# Patient Record
Sex: Female | Born: 1955
Health system: Southern US, Community
[De-identification: ages and names within clinical notes are randomized; demographics above are authoritative.]

## PROBLEM LIST (undated history)

## (undated) DIAGNOSIS — K219 Gastro-esophageal reflux disease without esophagitis: Secondary | ICD-10-CM

## (undated) DIAGNOSIS — Z973 Presence of spectacles and contact lenses: Secondary | ICD-10-CM

## (undated) DIAGNOSIS — F419 Anxiety disorder, unspecified: Secondary | ICD-10-CM

## (undated) DIAGNOSIS — Z9889 Other specified postprocedural states: Secondary | ICD-10-CM

## (undated) DIAGNOSIS — R51 Headache: Secondary | ICD-10-CM

## (undated) DIAGNOSIS — I10 Essential (primary) hypertension: Secondary | ICD-10-CM

## (undated) DIAGNOSIS — K5909 Other constipation: Secondary | ICD-10-CM

## (undated) DIAGNOSIS — R112 Nausea with vomiting, unspecified: Secondary | ICD-10-CM

## (undated) DIAGNOSIS — Z1371 Encounter for nonprocreative screening for genetic disease carrier status: Secondary | ICD-10-CM

## (undated) HISTORY — PX: WISDOM TOOTH EXTRACTION: SHX21

## (undated) HISTORY — PX: OTHER SURGICAL HISTORY: SHX169

## (undated) HISTORY — DX: Essential (primary) hypertension: I10

## (undated) HISTORY — DX: Headache: R51

## (undated) HISTORY — DX: Nausea with vomiting, unspecified: R11.2

## (undated) HISTORY — PX: BREAST EXCISIONAL BIOPSY: SUR124

## (undated) HISTORY — DX: Other specified postprocedural states: Z98.890

## (undated) HISTORY — DX: Encounter for nonprocreative screening for genetic disease carrier status: Z13.71

## (undated) HISTORY — DX: Gastro-esophageal reflux disease without esophagitis: K21.9

---

## 1998-11-04 ENCOUNTER — Emergency Department (HOSPITAL_COMMUNITY): Admission: EM | Admit: 1998-11-04 | Discharge: 1998-11-05 | Payer: Self-pay | Admitting: Emergency Medicine

## 1999-06-02 ENCOUNTER — Other Ambulatory Visit: Admission: RE | Admit: 1999-06-02 | Discharge: 1999-06-02 | Payer: Self-pay | Admitting: Obstetrics and Gynecology

## 1999-06-26 ENCOUNTER — Ambulatory Visit (HOSPITAL_BASED_OUTPATIENT_CLINIC_OR_DEPARTMENT_OTHER): Admission: RE | Admit: 1999-06-26 | Discharge: 1999-06-26 | Payer: Self-pay | Admitting: *Deleted

## 1999-06-26 ENCOUNTER — Encounter (INDEPENDENT_AMBULATORY_CARE_PROVIDER_SITE_OTHER): Payer: Self-pay | Admitting: *Deleted

## 1999-10-12 ENCOUNTER — Emergency Department (HOSPITAL_COMMUNITY): Admission: EM | Admit: 1999-10-12 | Discharge: 1999-10-12 | Payer: Self-pay | Admitting: Internal Medicine

## 2000-06-10 ENCOUNTER — Other Ambulatory Visit: Admission: RE | Admit: 2000-06-10 | Discharge: 2000-06-10 | Payer: Self-pay | Admitting: Obstetrics and Gynecology

## 2000-11-23 ENCOUNTER — Emergency Department (HOSPITAL_COMMUNITY): Admission: EM | Admit: 2000-11-23 | Discharge: 2000-11-23 | Payer: Self-pay | Admitting: Emergency Medicine

## 2001-07-04 ENCOUNTER — Other Ambulatory Visit: Admission: RE | Admit: 2001-07-04 | Discharge: 2001-07-04 | Payer: Self-pay | Admitting: Obstetrics and Gynecology

## 2002-10-12 ENCOUNTER — Other Ambulatory Visit: Admission: RE | Admit: 2002-10-12 | Discharge: 2002-10-12 | Payer: Self-pay | Admitting: Obstetrics and Gynecology

## 2003-12-28 ENCOUNTER — Encounter: Admission: RE | Admit: 2003-12-28 | Discharge: 2003-12-28 | Payer: Self-pay | Admitting: *Deleted

## 2004-02-17 HISTORY — PX: DILATION AND CURETTAGE OF UTERUS: SHX78

## 2004-12-31 ENCOUNTER — Other Ambulatory Visit: Admission: RE | Admit: 2004-12-31 | Discharge: 2004-12-31 | Payer: Self-pay | Admitting: *Deleted

## 2005-01-14 ENCOUNTER — Encounter: Admission: RE | Admit: 2005-01-14 | Discharge: 2005-01-14 | Payer: Self-pay | Admitting: Internal Medicine

## 2005-01-27 ENCOUNTER — Encounter: Admission: RE | Admit: 2005-01-27 | Discharge: 2005-01-27 | Payer: Self-pay | Admitting: *Deleted

## 2005-11-06 ENCOUNTER — Ambulatory Visit: Payer: Self-pay | Admitting: Internal Medicine

## 2005-11-30 ENCOUNTER — Ambulatory Visit: Payer: Self-pay | Admitting: Internal Medicine

## 2005-11-30 ENCOUNTER — Encounter (INDEPENDENT_AMBULATORY_CARE_PROVIDER_SITE_OTHER): Payer: Self-pay | Admitting: *Deleted

## 2006-01-15 ENCOUNTER — Encounter: Admission: RE | Admit: 2006-01-15 | Discharge: 2006-01-15 | Payer: Self-pay | Admitting: *Deleted

## 2006-02-03 ENCOUNTER — Other Ambulatory Visit: Admission: RE | Admit: 2006-02-03 | Discharge: 2006-02-03 | Payer: Self-pay | Admitting: *Deleted

## 2007-01-18 ENCOUNTER — Encounter: Admission: RE | Admit: 2007-01-18 | Discharge: 2007-01-18 | Payer: Self-pay | Admitting: *Deleted

## 2007-05-05 ENCOUNTER — Other Ambulatory Visit: Admission: RE | Admit: 2007-05-05 | Discharge: 2007-05-05 | Payer: Self-pay | Admitting: *Deleted

## 2007-07-21 ENCOUNTER — Encounter: Admission: RE | Admit: 2007-07-21 | Discharge: 2007-07-21 | Payer: Self-pay | Admitting: Gynecology

## 2007-07-22 ENCOUNTER — Encounter: Admission: RE | Admit: 2007-07-22 | Discharge: 2007-07-22 | Payer: Self-pay | Admitting: Gynecology

## 2007-08-06 ENCOUNTER — Emergency Department (HOSPITAL_COMMUNITY): Admission: EM | Admit: 2007-08-06 | Discharge: 2007-08-06 | Payer: Self-pay | Admitting: Emergency Medicine

## 2007-10-07 ENCOUNTER — Emergency Department (HOSPITAL_BASED_OUTPATIENT_CLINIC_OR_DEPARTMENT_OTHER): Admission: EM | Admit: 2007-10-07 | Discharge: 2007-10-07 | Payer: Self-pay | Admitting: Emergency Medicine

## 2008-02-07 ENCOUNTER — Encounter: Admission: RE | Admit: 2008-02-07 | Discharge: 2008-03-26 | Payer: Self-pay | Admitting: Internal Medicine

## 2008-07-23 ENCOUNTER — Encounter: Admission: RE | Admit: 2008-07-23 | Discharge: 2008-07-23 | Payer: Self-pay | Admitting: Obstetrics and Gynecology

## 2008-12-08 ENCOUNTER — Encounter: Admission: RE | Admit: 2008-12-08 | Discharge: 2008-12-08 | Payer: Self-pay | Admitting: Internal Medicine

## 2009-01-24 ENCOUNTER — Ambulatory Visit: Payer: Self-pay | Admitting: Radiology

## 2009-01-24 ENCOUNTER — Emergency Department (HOSPITAL_BASED_OUTPATIENT_CLINIC_OR_DEPARTMENT_OTHER): Admission: EM | Admit: 2009-01-24 | Discharge: 2009-01-24 | Payer: Self-pay | Admitting: Emergency Medicine

## 2009-07-25 ENCOUNTER — Encounter: Admission: RE | Admit: 2009-07-25 | Discharge: 2009-07-25 | Payer: Self-pay | Admitting: Obstetrics and Gynecology

## 2009-10-01 ENCOUNTER — Encounter: Admission: RE | Admit: 2009-10-01 | Discharge: 2009-10-01 | Payer: Self-pay | Admitting: Obstetrics and Gynecology

## 2009-10-10 ENCOUNTER — Ambulatory Visit (HOSPITAL_COMMUNITY): Admission: RE | Admit: 2009-10-10 | Discharge: 2009-10-10 | Payer: Self-pay | Admitting: Obstetrics and Gynecology

## 2010-03-07 ENCOUNTER — Emergency Department (HOSPITAL_BASED_OUTPATIENT_CLINIC_OR_DEPARTMENT_OTHER)
Admission: EM | Admit: 2010-03-07 | Discharge: 2010-03-07 | Payer: Self-pay | Source: Home / Self Care | Admitting: Emergency Medicine

## 2010-03-09 ENCOUNTER — Encounter: Payer: Self-pay | Admitting: Obstetrics and Gynecology

## 2010-05-02 LAB — BASIC METABOLIC PANEL
BUN: 12 mg/dL (ref 6–23)
CO2: 24 mEq/L (ref 19–32)
Calcium: 9.2 mg/dL (ref 8.4–10.5)
Chloride: 106 mEq/L (ref 96–112)
Creatinine, Ser: 0.74 mg/dL (ref 0.4–1.2)
GFR calc Af Amer: >60 mL/min (ref >60)
GFR calc non Af Amer: >60 mL/min (ref >60)
Glucose, Bld: 76 mg/dL (ref 70–99)
Potassium: 3.8 mEq/L (ref 3.5–5.1)
Sodium: 139 mEq/L (ref 135–145)

## 2010-05-02 LAB — CBC
HCT: 39 % (ref 36.0–46.0)
Hemoglobin: 13.1 g/dL (ref 12.0–15.0)
MCH: 30.4 pg (ref 26.0–34.0)
MCHC: 33.5 g/dL (ref 30.0–36.0)
MCV: 90.7 fL (ref 78.0–100.0)
Platelets: 210 10*3/uL (ref 150–400)
RBC: 4.3 MIL/uL (ref 3.87–5.11)
RDW: 12.9 % (ref 11.5–15.5)
WBC: 6.4 10*3/uL (ref 4.0–10.5)

## 2010-05-02 LAB — PREGNANCY, URINE: Preg Test, Ur: NEGATIVE

## 2010-05-20 LAB — DIFFERENTIAL
Basophils Absolute: 0 10*3/uL (ref 0.0–0.1)
Basophils Relative: 1 % (ref 0–1)
Eosinophils Absolute: 0.1 10*3/uL (ref 0.0–0.7)
Eosinophils Relative: 2 % (ref 0–5)
Lymphocytes Relative: 33 % (ref 12–46)
Lymphs Abs: 1.9 10*3/uL (ref 0.7–4.0)
Monocytes Absolute: 0.5 10*3/uL (ref 0.1–1.0)
Monocytes Relative: 8 % (ref 3–12)
Neutro Abs: 3.1 10*3/uL (ref 1.7–7.7)
Neutrophils Relative %: 56 % (ref 43–77)

## 2010-05-20 LAB — COMPREHENSIVE METABOLIC PANEL
ALT: 18 U/L (ref 0–35)
AST: 18 U/L (ref 0–37)
Albumin: 4.3 g/dL (ref 3.5–5.2)
Alkaline Phosphatase: 45 U/L (ref 39–117)
BUN: 16 mg/dL (ref 6–23)
CO2: 25 mEq/L (ref 19–32)
Calcium: 9.3 mg/dL (ref 8.4–10.5)
Chloride: 104 mEq/L (ref 96–112)
Creatinine, Ser: 0.7 mg/dL (ref 0.4–1.2)
GFR calc Af Amer: >60 mL/min (ref >60)
GFR calc non Af Amer: >60 mL/min (ref >60)
Glucose, Bld: 85 mg/dL (ref 70–99)
Potassium: 3.7 mEq/L (ref 3.5–5.1)
Sodium: 141 mEq/L (ref 135–145)
Total Bilirubin: 0.5 mg/dL (ref 0.3–1.2)
Total Protein: 7 g/dL (ref 6.0–8.3)

## 2010-05-20 LAB — CBC
HCT: 36.4 % (ref 36.0–46.0)
Hemoglobin: 12.7 g/dL (ref 12.0–15.0)
MCHC: 34.8 g/dL (ref 30.0–36.0)
MCV: 89.3 fL (ref 78.0–100.0)
Platelets: 187 10*3/uL (ref 150–400)
RBC: 4.08 MIL/uL (ref 3.87–5.11)
RDW: 13.2 % (ref 11.5–15.5)
WBC: 5.6 10*3/uL (ref 4.0–10.5)

## 2010-05-20 LAB — URINALYSIS, ROUTINE W REFLEX MICROSCOPIC
Bilirubin Urine: NEGATIVE
Glucose, UA: NEGATIVE mg/dL
Ketones, ur: NEGATIVE mg/dL
Nitrite: NEGATIVE
Protein, ur: NEGATIVE mg/dL
Specific Gravity, Urine: 1.014 (ref 1.005–1.030)
Urobilinogen, UA: 0.2 mg/dL (ref 0.0–1.0)
pH: 5.5 (ref 5.0–8.0)

## 2010-05-20 LAB — PREGNANCY, URINE: Preg Test, Ur: NEGATIVE

## 2010-05-20 LAB — URINE MICROSCOPIC-ADD ON

## 2010-07-04 NOTE — Op Note (Signed)
[place]. [facility]  Patient:    Kayla Guerra, Kayla Guerra                   MRN: [id number] Proc. Date: 06/26/99 Adm. Date:  [id number] Attending:  Stephenie Acres                           Operative Report  PREOPERATIVE DIAGNOSIS:  Right breast mass.  POSTOPERATIVE DIAGNOSIS:  Right breast mass.  PROCEDURE: Excisional right breast biopsy.  SURGEON:  Catalina Lunger, M.D.  ANESTHESIA:  Local MAC.  DESCRIPTION OF PROCEDURE:  The patient was taken to the operating room placed in the supine position after adequate anesthesia was induced.  The right breast was prepped and draped in the normal sterile fashion.  Using 1% lidocaine local anesthesia the skin overlying the mass and the subcutaneous tissues surrounding it were anesthetized.  Upper outer quadrant curvilinear incision was made, dissected down onto the mass which was a firm fibrotic area.  This was grasped with a 2-0 Vicryl, delivered through the wound.  It was excised in its entirety.  Adequate hemostasis was ensured and the skin was closed with subcuticular 4-0 monacryl.  Steri-strips and sterile dressing was applied.  The patient tolerated the procedure well and went to PACU in good condition. DD:  06/26/99 TD:  06/27/99 Job: [id number]

## 2010-09-01 ENCOUNTER — Other Ambulatory Visit: Payer: Self-pay | Admitting: Obstetrics and Gynecology

## 2010-09-01 DIAGNOSIS — Z1231 Encounter for screening mammogram for malignant neoplasm of breast: Secondary | ICD-10-CM

## 2010-09-09 ENCOUNTER — Ambulatory Visit
Admission: RE | Admit: 2010-09-09 | Discharge: 2010-09-09 | Disposition: A | Discharge status: Home / Self Care | Payer: BC Managed Care – PPO | Source: Ambulatory Visit | Attending: Obstetrics and Gynecology | Admitting: Obstetrics and Gynecology

## 2010-09-09 DIAGNOSIS — Z1231 Encounter for screening mammogram for malignant neoplasm of breast: Secondary | ICD-10-CM

## 2011-01-05 DIAGNOSIS — I10 Essential (primary) hypertension: Secondary | ICD-10-CM | POA: Insufficient documentation

## 2011-07-17 DIAGNOSIS — Z780 Asymptomatic menopausal state: Secondary | ICD-10-CM | POA: Insufficient documentation

## 2011-07-21 ENCOUNTER — Encounter: Payer: Self-pay | Admitting: Obstetrics and Gynecology

## 2011-07-22 ENCOUNTER — Ambulatory Visit (INDEPENDENT_AMBULATORY_CARE_PROVIDER_SITE_OTHER): Payer: BC Managed Care – PPO | Admitting: Obstetrics and Gynecology

## 2011-07-22 ENCOUNTER — Ambulatory Visit (INDEPENDENT_AMBULATORY_CARE_PROVIDER_SITE_OTHER): Payer: BC Managed Care – PPO

## 2011-07-22 ENCOUNTER — Encounter: Payer: Self-pay | Admitting: Obstetrics and Gynecology

## 2011-07-22 VITALS — BP 120/80 | Ht 62.6 in | Wt 111.0 lb

## 2011-07-22 DIAGNOSIS — Z8262 Family history of osteoporosis: Secondary | ICD-10-CM

## 2011-07-22 DIAGNOSIS — M810 Age-related osteoporosis without current pathological fracture: Secondary | ICD-10-CM

## 2011-07-22 DIAGNOSIS — M8580 Other specified disorders of bone density and structure, unspecified site: Secondary | ICD-10-CM

## 2011-07-22 DIAGNOSIS — M899 Disorder of bone, unspecified: Secondary | ICD-10-CM

## 2011-07-22 DIAGNOSIS — Z8639 Personal history of other endocrine, nutritional and metabolic disease: Secondary | ICD-10-CM

## 2011-07-22 DIAGNOSIS — Z124 Encounter for screening for malignant neoplasm of cervix: Secondary | ICD-10-CM

## 2011-07-22 DIAGNOSIS — Z862 Personal history of diseases of the blood and blood-forming organs and certain disorders involving the immune mechanism: Secondary | ICD-10-CM

## 2011-07-22 MED ORDER — AMBULATORY NON FORMULARY MEDICATION: Status: DC

## 2011-07-22 NOTE — Progress Notes (Signed)
The patient is not taking hormone replacement therapy The patient  is not taking a Calcium supplement. Post-menopausal bleeding:no  Last Pap: was normal May  2012 Last mammogram: was normal 2013 Last DEXA scan : T=0    Never had one  Last colonoscopy:was normal 6 years ago per pt   Urinary symptoms: none Normal bowel movements: Yes Reports abuse at home: No:  Subjective:    Kayla Guerra is a 56 y.o. female G1P1 who presents for annual exam.Last cycle September 2012. No PMB. Mother and sister with osteoporosis The patient has no complaints today.   The following portions of the patient's history were reviewed and updated as appropriate: allergies, current medications, past family history, past medical history, past social history, past surgical history and problem list.  Review of Systems Pertinent items are noted in HPI. Gastrointestinal:No change in bowel habits, no abdominal pain, no rectal bleeding Genitourinary:negative for dysuria, frequency, hematuria, nocturia and urinary incontinence    Objective:     BP 120/80  Ht 5' 2.6" (1.59 m)  Wt 111 lb (50.349 kg)  BMI 19.91 kg/m2  LMP 07/24/2010  Weight:  Wt Readings from Last 1 Encounters:  07/22/11 111 lb (50.349 kg)     BMI: Body mass index is 19.91 kg/(m^2). General Appearance: Alert, appropriate appearance for age. No acute distress HEENT: Grossly normal Neck / Thyroid: Supple, no masses, nodes or enlargement Lungs: clear to auscultation bilaterally Back: No CVA tenderness Breast Exam: No masses or nodes.No dimpling, nipple retraction or discharge. Cardiovascular: Regular rate and rhythm. S1, S2, no murmur Gastrointestinal: Soft, non-tender, no masses or organomegaly Pelvic Exam: Vulva and vagina appear normal. Bimanual exam reveals normal uterus and adnexa. Rectovaginal: normal rectal, no masses Lymphatic Exam: Non-palpable nodes in neck, clavicular, axillary, or inguinal regions Skin: no rash or  abnormalities Neurologic: Normal gait and speech, no tremor  Psychiatric: Alert and oriented, appropriate affect.    Urinalysis:Not done      Assessment:    Normal gyn exam  Vaginal dryness Family h/o osteoporosis and hypothyroidism     Plan:    Pap  DEXA TSH Reviewed Silicone base lubricant and olive oil Yell Gel ordered  Follow-up:  for annual exam

## 2011-07-23 LAB — TSH: TSH: 0.742 u[IU]/mL (ref 0.350–4.500)

## 2011-07-24 LAB — PAP IG W/ RFLX HPV ASCU

## 2011-08-10 ENCOUNTER — Other Ambulatory Visit: Payer: Self-pay | Admitting: Obstetrics and Gynecology

## 2011-08-10 DIAGNOSIS — Z1231 Encounter for screening mammogram for malignant neoplasm of breast: Secondary | ICD-10-CM

## 2011-08-18 ENCOUNTER — Telehealth: Payer: Self-pay | Admitting: Obstetrics and Gynecology

## 2011-08-18 NOTE — Telephone Encounter (Signed)
Pt requesting lab results from 07-22-11. Notified pt TSH and Vit D were both normal.  Pt agreeable.  ld

## 2011-08-18 NOTE — Telephone Encounter (Signed)
sr pt 

## 2011-09-10 ENCOUNTER — Ambulatory Visit
Admission: RE | Admit: 2011-09-10 | Discharge: 2011-09-10 | Disposition: A | Discharge status: Home / Self Care | Payer: BC Managed Care – PPO | Source: Ambulatory Visit | Attending: Obstetrics and Gynecology | Admitting: Obstetrics and Gynecology

## 2011-09-10 DIAGNOSIS — Z1231 Encounter for screening mammogram for malignant neoplasm of breast: Secondary | ICD-10-CM

## 2012-02-04 ENCOUNTER — Emergency Department (HOSPITAL_BASED_OUTPATIENT_CLINIC_OR_DEPARTMENT_OTHER)
Admission: EM | Admit: 2012-02-04 | Discharge: 2012-02-04 | Disposition: A | Discharge status: Home / Self Care | Payer: BC Managed Care – PPO | Attending: Emergency Medicine | Admitting: Emergency Medicine

## 2012-02-04 ENCOUNTER — Encounter (HOSPITAL_BASED_OUTPATIENT_CLINIC_OR_DEPARTMENT_OTHER): Payer: Self-pay | Admitting: Family Medicine

## 2012-02-04 DIAGNOSIS — I10 Essential (primary) hypertension: Secondary | ICD-10-CM | POA: Insufficient documentation

## 2012-02-04 DIAGNOSIS — Z79899 Other long term (current) drug therapy: Secondary | ICD-10-CM | POA: Insufficient documentation

## 2012-02-04 DIAGNOSIS — G43909 Migraine, unspecified, not intractable, without status migrainosus: Secondary | ICD-10-CM | POA: Insufficient documentation

## 2012-02-04 DIAGNOSIS — G43901 Migraine, unspecified, not intractable, with status migrainosus: Secondary | ICD-10-CM

## 2012-02-04 DIAGNOSIS — R110 Nausea: Secondary | ICD-10-CM | POA: Insufficient documentation

## 2012-02-04 MED ORDER — IBUPROFEN 600 MG PO TABS: 600.0000 mg | ORAL_TABLET | Freq: Four times a day (QID) | ORAL | Status: DC | PRN

## 2012-02-04 MED ORDER — ONDANSETRON HCL 4 MG/2ML IJ SOLN
INTRAMUSCULAR | Status: AC
  Administered 2012-02-04: 4 mg
  Filled 2012-02-04: qty 2

## 2012-02-04 MED ORDER — DEXAMETHASONE SODIUM PHOSPHATE 10 MG/ML IJ SOLN
10.0000 mg | Freq: Once | INTRAMUSCULAR | Status: AC
  Administered 2012-02-04: 10 mg via INTRAVENOUS
  Filled 2012-02-04: qty 1

## 2012-02-04 MED ORDER — KETOROLAC TROMETHAMINE 30 MG/ML IJ SOLN
30.0000 mg | Freq: Once | INTRAMUSCULAR | Status: AC
  Administered 2012-02-04: 30 mg via INTRAVENOUS
  Filled 2012-02-04: qty 1

## 2012-02-04 MED ORDER — ONDANSETRON 8 MG PO TBDP: 8.0000 mg | ORAL_TABLET | Freq: Three times a day (TID) | ORAL | Status: DC | PRN

## 2012-02-04 MED ORDER — SODIUM CHLORIDE 0.9 % IV BOLUS (SEPSIS)
1000.0000 mL | Freq: Once | INTRAVENOUS | Status: AC
  Administered 2012-02-04: 1000 mL via INTRAVENOUS

## 2012-02-04 MED ORDER — HYDROCODONE-ACETAMINOPHEN 5-325 MG PO TABS: 1.0000 | ORAL_TABLET | Freq: Four times a day (QID) | ORAL | Status: DC | PRN

## 2012-02-04 MED ORDER — METOCLOPRAMIDE HCL 5 MG/ML IJ SOLN
10.0000 mg | Freq: Once | INTRAMUSCULAR | Status: AC
  Administered 2012-02-04: 10 mg via INTRAVENOUS
  Filled 2012-02-04: qty 2

## 2012-02-04 NOTE — ED Notes (Signed)
Pt states she has a handicapped son at home and has to leave. C.o nausea. Headache is better.

## 2012-02-04 NOTE — ED Notes (Signed)
Pt c/o migraine since awakening today. Pt also c/o nausea and photophobia. Pt has h/o same. Pt is alert and oriented. Pt has seen neuro for same and sts she is not on meds for migraines.

## 2012-02-05 NOTE — ED Provider Notes (Signed)
History     CSN: [id number]  Arrival date & time 02/04/12  4098   First MD Initiated Contact with Patient 02/04/12 0900      Chief Complaint  Patient presents with  . Migraine    (Consider location/radiation/quality/duration/timing/severity/associated sxs/prior treatment) HPI Comments: PT comes in with cc of headaches. Pt has hx of migraines, and current headache is typical of her migraines.  The headache is located in the occipital region, and is throbbing, worse with light and has associated nausea with emesis. Pt takes motrin for pain. No neck pain, stiffness, visual complains, seizures, altered mental status, loss of consciousness, new weakness, or numbness, no gait instability.  Patient is a 56 y.o. female presenting with migraines. The history is provided by the patient.  Migraine Associated symptoms include headaches. Pertinent negatives include no chest pain, no abdominal pain and no shortness of breath.    Past Medical History  Diagnosis Date  . Headache   . Hypertension     Past Surgical History  Procedure Date  . Dilation and curettage of uterus   . Hysteroscopy 2006  2011  . Breast surgery     lump removed  . Dental implants   . Wisdom tooth extraction     Family History  Problem Relation Age of Onset  . Cancer Mother 53    breast  . Heart disease Mother   . Hypertension Mother   . Thyroid disease Mother   . Cancer Cousin 30    breast  . Asthma Father     History  Substance Use Topics  . Smoking status: Never Smoker   . Smokeless tobacco: Never Used  . Alcohol Use: Yes     Comment: occ    OB History    Grav Para Term Preterm Abortions TAB SAB Ect Mult Living   1 1              Review of Systems  Constitutional: Negative for activity change.  HENT: Negative for facial swelling and neck pain.   Respiratory: Negative for cough, shortness of breath and wheezing.   Cardiovascular: Negative for chest pain.  Gastrointestinal: Positive for  nausea. Negative for vomiting, abdominal pain, diarrhea, constipation, blood in stool and abdominal distention.  Genitourinary: Negative for hematuria and difficulty urinating.  Skin: Negative for color change.  Neurological: Positive for headaches. Negative for speech difficulty.  Hematological: Does not bruise/bleed easily.  Psychiatric/Behavioral: Negative for confusion.    Allergies  Penicillins and Tetracyclines & related  Home Medications   Current Outpatient Rx  Name  Route  Sig  Dispense  Refill  . AMBULATORY NON FORMULARY MEDICATION      Medication Name: Yell Gel Apply small amount to clitoris 30 minutes before intercourse   10 g   11   . HYDROCODONE-ACETAMINOPHEN 5-325 MG PO TABS   Oral   Take 1 tablet by mouth every 6 (six) hours as needed for pain (severe headache only).   10 tablet   0   . IBUPROFEN 600 MG PO TABS   Oral   Take 1 tablet (600 mg total) by mouth every 6 (six) hours as needed for pain.   30 tablet   0   . METOPROLOL SUCCINATE ER 25 MG PO TB24   Oral   Take 25 mg by mouth daily.         Marland Kitchen ONDANSETRON 8 MG PO TBDP   Oral   Take 1 tablet (8 mg total) by mouth every 8 (  eight) hours as needed for nausea.   20 tablet   0     BP 173/98  Pulse 74  Temp 97.6 F (36.4 C) (Oral)  Resp 14  SpO2 99%  LMP 07/24/2010  Physical Exam  Nursing note and vitals reviewed. Constitutional: She is oriented to person, place, and time. She appears well-developed.  HENT:  Head: Normocephalic and atraumatic.  Eyes: Conjunctivae normal and EOM are normal. Pupils are equal, round, and reactive to light.  Neck: Normal range of motion. Neck supple.  Cardiovascular: Normal rate, regular rhythm and normal heart sounds.   Pulmonary/Chest: Effort normal and breath sounds normal. No respiratory distress.  Abdominal: Soft. Bowel sounds are normal. She exhibits no distension. There is no tenderness. There is no rebound and no guarding.  Neurological: She is alert  and oriented to person, place, and time.       Cerebellar exam is normal (finger to nose) Sensory exam normal for bilateral upper and lower extremities - and patient is able to discriminate between sharp and dull. Motor exam is 4+/5  Skin: Skin is warm and dry.    ED Course  Procedures (including critical care time)  Labs Reviewed - No data to display No results found.   1. Status migrainosus       MDM  DDX includes: Primary headaches - including migrainous headaches, cluster headaches, tension headaches. ICH Carotid dissection Cavernous sinus thrombosis Meningitis Encephalitis Sinusitis Tumor Vascular headaches AV malformation Brain aneurysm Muscular headaches  A/P: Pt comes in with cc of headaches. No concerns for life threatening secondary headaches because of known hx of same headaches in the past, and dx of migraines. Will try to break the pain, and when better will discharge.  Derwood Kaplan, MD 02/05/12 1950

## 2012-07-28 ENCOUNTER — Other Ambulatory Visit: Payer: Self-pay

## 2012-07-28 DIAGNOSIS — Z1231 Encounter for screening mammogram for malignant neoplasm of breast: Secondary | ICD-10-CM

## 2012-09-12 ENCOUNTER — Ambulatory Visit
Admission: RE | Admit: 2012-09-12 | Discharge: 2012-09-12 | Disposition: A | Discharge status: Home / Self Care | Payer: BC Managed Care – PPO | Source: Ambulatory Visit

## 2012-09-12 DIAGNOSIS — Z1231 Encounter for screening mammogram for malignant neoplasm of breast: Secondary | ICD-10-CM

## 2012-10-10 ENCOUNTER — Other Ambulatory Visit: Payer: Self-pay | Admitting: Obstetrics and Gynecology

## 2012-10-10 DIAGNOSIS — Z803 Family history of malignant neoplasm of breast: Secondary | ICD-10-CM

## 2012-10-19 ENCOUNTER — Ambulatory Visit
Admission: RE | Admit: 2012-10-19 | Discharge: 2012-10-19 | Disposition: A | Discharge status: Home / Self Care | Payer: Self-pay | Source: Ambulatory Visit | Attending: Obstetrics and Gynecology | Admitting: Obstetrics and Gynecology

## 2012-10-19 DIAGNOSIS — Z803 Family history of malignant neoplasm of breast: Secondary | ICD-10-CM

## 2012-10-19 MED ORDER — GADOBENATE DIMEGLUMINE 529 MG/ML IV SOLN
9.0000 mL | Freq: Once | INTRAVENOUS | Status: AC | PRN
  Administered 2012-10-19: 9 mL via INTRAVENOUS

## 2013-08-09 ENCOUNTER — Other Ambulatory Visit: Payer: Self-pay

## 2013-08-09 DIAGNOSIS — Z1231 Encounter for screening mammogram for malignant neoplasm of breast: Secondary | ICD-10-CM

## 2013-09-13 ENCOUNTER — Ambulatory Visit: Payer: Self-pay

## 2013-09-15 ENCOUNTER — Ambulatory Visit
Admission: RE | Admit: 2013-09-15 | Discharge: 2013-09-15 | Disposition: A | Discharge status: Home / Self Care | Payer: BC Managed Care – PPO | Source: Ambulatory Visit

## 2013-09-15 ENCOUNTER — Encounter (INDEPENDENT_AMBULATORY_CARE_PROVIDER_SITE_OTHER): Payer: Self-pay

## 2013-09-15 DIAGNOSIS — Z1231 Encounter for screening mammogram for malignant neoplasm of breast: Secondary | ICD-10-CM

## 2013-12-18 ENCOUNTER — Encounter (HOSPITAL_BASED_OUTPATIENT_CLINIC_OR_DEPARTMENT_OTHER): Payer: Self-pay | Admitting: Family Medicine

## 2014-01-03 ENCOUNTER — Other Ambulatory Visit: Payer: Self-pay | Admitting: Dermatology

## 2014-08-13 ENCOUNTER — Other Ambulatory Visit: Payer: Self-pay

## 2014-08-13 DIAGNOSIS — Z1231 Encounter for screening mammogram for malignant neoplasm of breast: Secondary | ICD-10-CM

## 2014-09-19 ENCOUNTER — Ambulatory Visit
Admission: RE | Admit: 2014-09-19 | Discharge: 2014-09-19 | Disposition: A | Discharge status: Home / Self Care | Payer: BLUE CROSS/BLUE SHIELD | Source: Ambulatory Visit

## 2014-09-19 DIAGNOSIS — Z1231 Encounter for screening mammogram for malignant neoplasm of breast: Secondary | ICD-10-CM

## 2015-08-28 ENCOUNTER — Other Ambulatory Visit: Payer: Self-pay | Admitting: Internal Medicine

## 2015-08-28 DIAGNOSIS — Z1231 Encounter for screening mammogram for malignant neoplasm of breast: Secondary | ICD-10-CM

## 2015-09-20 ENCOUNTER — Ambulatory Visit
Admission: RE | Admit: 2015-09-20 | Discharge: 2015-09-20 | Disposition: A | Discharge status: Home / Self Care | Payer: BLUE CROSS/BLUE SHIELD | Source: Ambulatory Visit | Attending: Internal Medicine | Admitting: Internal Medicine

## 2015-09-20 DIAGNOSIS — Z1231 Encounter for screening mammogram for malignant neoplasm of breast: Secondary | ICD-10-CM

## 2015-10-04 ENCOUNTER — Encounter: Payer: Self-pay | Admitting: Internal Medicine

## 2015-10-04 ENCOUNTER — Other Ambulatory Visit: Payer: Self-pay | Admitting: Obstetrics and Gynecology

## 2015-10-04 DIAGNOSIS — Z803 Family history of malignant neoplasm of breast: Secondary | ICD-10-CM

## 2015-10-24 ENCOUNTER — Ambulatory Visit
Admission: RE | Admit: 2015-10-24 | Discharge: 2015-10-24 | Disposition: A | Discharge status: Home / Self Care | Payer: BLUE CROSS/BLUE SHIELD | Source: Ambulatory Visit | Attending: Obstetrics and Gynecology | Admitting: Obstetrics and Gynecology

## 2015-10-24 DIAGNOSIS — Z803 Family history of malignant neoplasm of breast: Secondary | ICD-10-CM

## 2015-10-24 MED ORDER — GADOBENATE DIMEGLUMINE 529 MG/ML IV SOLN
9.0000 mL | Freq: Once | INTRAVENOUS | Status: AC | PRN
  Administered 2015-10-24: 9 mL via INTRAVENOUS

## 2015-10-29 ENCOUNTER — Encounter: Payer: Self-pay | Admitting: Internal Medicine

## 2016-01-06 ENCOUNTER — Ambulatory Visit (INDEPENDENT_AMBULATORY_CARE_PROVIDER_SITE_OTHER): Payer: BLUE CROSS/BLUE SHIELD | Admitting: Internal Medicine

## 2016-01-06 ENCOUNTER — Encounter: Payer: Self-pay | Admitting: Internal Medicine

## 2016-01-06 ENCOUNTER — Encounter (INDEPENDENT_AMBULATORY_CARE_PROVIDER_SITE_OTHER): Payer: Self-pay

## 2016-01-06 VITALS — BP 106/72 | HR 76 | Ht 62.3 in | Wt 102.4 lb

## 2016-01-06 DIAGNOSIS — R1013 Epigastric pain: Secondary | ICD-10-CM | POA: Diagnosis not present

## 2016-01-06 DIAGNOSIS — K5900 Constipation, unspecified: Secondary | ICD-10-CM | POA: Diagnosis not present

## 2016-01-06 DIAGNOSIS — Z1211 Encounter for screening for malignant neoplasm of colon: Secondary | ICD-10-CM

## 2016-01-06 NOTE — Progress Notes (Signed)
HISTORY OF PRESENT ILLNESS:  Kayla Guerra is a 60 y.o. 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 The patient was last seen October 2007 when she underwent colonoscopy and upper endoscopy to evaluate constipation, microcytic anemia, history of colon cancer in second-degree relatives, and chest pain. Both colonoscopy with ileoscopy and upper endoscopy were normal. Biopsies of the duodenum were normal. Patient presents now with a 3 month history of problems with epigastric pain and "esophageal rawness". She was evaluated by her PCP and placed on omeprazole. As well she reports avoiding certain food items, as food can exacerbate her symptoms. No nocturnal symptoms. Overall she has noticed some improvement on PPI though incomplete. She does report significant globus type sensation. Reports stress as she takes care of her elderly mother and handicapped Adult son in home. Recently seen for an being treated for UTI. Review of outside blood work from 10/28/2015 reveals normal CBC and comprehensive metabolic panel. Next, the patient reports ongoing problems with chronic constipation. She reports a bowel movement approximate once per week. Has tried fiber without improvement. Reluctant to use stimulant laxatives as this may affect her mobility at work. She does get abdominal fullness and some discomfort after 4-5 days of no bowel movement. She denies bleeding. Again reports multiple second-degree relatives with a history of colon cancer. She has had no weight loss. Actually a few pound weight gain.   REVIEW OF SYSTEMS:  All non-GI ROS negative except for anxiety, excessive urination  Past Medical History:  Diagnosis Date  . GERD (gastroesophageal reflux disease)   . Headache(784.0)   . Hypertension     Past Surgical History:  Procedure Laterality Date  . BREAST SURGERY Right    lump removed  . Dental Implants    . DILATION AND CURETTAGE OF UTERUS    . HYSTEROSCOPY  2006  2011  . WISDOM TOOTH EXTRACTION      Social History Kayla Guerra  reports that she has never smoked. She has never used smokeless tobacco. She reports that she drinks alcohol. She reports that she does not use drugs.  family history includes ADD / ADHD in her son; Asthma in her father; Breast cancer (age of onset: 3430) in her cousin; Breast cancer (age of onset: 6252) in her mother; Heart disease in her mother; Hypertension in her mother; Other in her son; Stroke in her maternal grandmother; Thyroid disease in her mother.  Allergies  Allergen Reactions  . Tetracyclines & Related Other (See Comments)    Throat inflammed  . Penicillins   . Erythromycin        PHYSICAL EXAMINATION: Vital signs: BP 106/72 (BP Location: Left Arm, Patient Position: Sitting, Cuff Size: Normal)   Pulse 76   Ht 5' 2.3" (1.582 m) Comment: height measured without shoes  Wt 102 lb 6 oz (46.4 kg)   LMP 07/24/2010   BMI 18.54 kg/m   Constitutional: generally well-appearing, no acute distress Psychiatric: alert and oriented x3, cooperative Eyes: extraocular movements intact, anicteric, conjunctiva pink Mouth: oral pharynx moist, no lesions Neck: suppleWithout thyromegaly Lymph: no supraclavicular lymphadenopathy Cardiovascular: heart regular rate and rhythm, no murmur Lungs: clear to auscultation bilaterally Abdomen: soft, nontender, nondistended, no obvious ascites, no peritoneal signs, normal bowel sounds, no organomegaly Rectal:Deferred until colonoscopy Extremities: no clubbing cyanosis or lower extremity edema bilaterally Skin: no lesions on visible extremities Neuro: No focal deficits. Cranial nerves intact  ASSESSMENT:  #1. Epigastric pain. Rule out acid peptic disorders. Rule out gallbladder disease. Rule out functional dyspepsia #2. Globus sensation. Likely functional #3. Chronic constipation.  Ongoing #4. Screening colonoscopy. Negative exam 2007. Multiple second degree relatives with colon cancer. Appropriate candidate for follow-up exam without contraindication.  #5. Stress and anxiety  PLAN:  #1. Reflux precautions #2. Continue PPI for now #3. Schedule upper endoscopy to evaluate upper abdominal pain.The nature of the procedure, as well as the risks, benefits, and alternatives were carefully and thoroughly reviewed with the patient. Ample time for discussion and questions allowed. The patient understood, was satisfied, and agreed to proceed. #4. Schedule abdominal ultrasound to evaluate upper abdominal pain. Rule out gallbladder disease #5. MiraLAX for chronic constipation. Titration strategy reviewed #6. Screening colonoscopy.The nature of the procedure, as well as the risks, benefits, and alternatives were carefully and thoroughly reviewed with the patient. Ample time for discussion and questions allowed. The patient understood, was satisfied, and agreed to proceed.  A copy of this consultation note has been sent to Kayla Guerra

## 2016-01-06 NOTE — Patient Instructions (Signed)
You have been scheduled for an abdominal ultrasound at [facility] (1st floor of hospital) on 01/15/2016 at 8:00am. Please arrive 15 minutes prior to your appointment for registration. Make certain not to have anything to eat or drink after midnight prior to your appointment. Should you need to reschedule your appointment, please contact radiology at [phone]. This test typically takes about 30 minutes to perform.  Please call back next month to schedule an endoscopy/colonoscopy.

## 2016-01-15 ENCOUNTER — Ambulatory Visit (HOSPITAL_COMMUNITY)
Admission: RE | Admit: 2016-01-15 | Discharge: 2016-01-15 | Disposition: A | Discharge status: Home / Self Care | Payer: BLUE CROSS/BLUE SHIELD | Source: Ambulatory Visit | Attending: Internal Medicine | Admitting: Internal Medicine

## 2016-01-15 DIAGNOSIS — R1013 Epigastric pain: Secondary | ICD-10-CM | POA: Insufficient documentation

## 2016-02-25 ENCOUNTER — Ambulatory Visit (AMBULATORY_SURGERY_CENTER): Payer: Self-pay

## 2016-02-25 VITALS — Ht 62.5 in | Wt 101.6 lb

## 2016-02-25 DIAGNOSIS — R1013 Epigastric pain: Secondary | ICD-10-CM

## 2016-02-25 DIAGNOSIS — Z1211 Encounter for screening for malignant neoplasm of colon: Secondary | ICD-10-CM

## 2016-02-25 MED ORDER — NA SULFATE-K SULFATE-MG SULF 17.5-3.13-1.6 GM/177ML PO SOLN: ORAL | 0 refills | Status: DC

## 2016-02-25 NOTE — Progress Notes (Signed)
Per pt, no allergies to soy or egg products.Pt not taking any weight loss meds or using  O2 at home. 

## 2016-03-13 ENCOUNTER — Encounter: Payer: Self-pay | Admitting: Internal Medicine

## 2016-03-23 ENCOUNTER — Telehealth: Payer: Self-pay | Admitting: Internal Medicine

## 2016-03-24 NOTE — Telephone Encounter (Signed)
No charge. 

## 2016-03-26 ENCOUNTER — Encounter: Payer: BLUE CROSS/BLUE SHIELD | Admitting: Internal Medicine

## 2016-05-18 ENCOUNTER — Ambulatory Visit (AMBULATORY_SURGERY_CENTER): Payer: BLUE CROSS/BLUE SHIELD | Admitting: Internal Medicine

## 2016-05-18 ENCOUNTER — Encounter: Payer: Self-pay | Admitting: Internal Medicine

## 2016-05-18 VITALS — BP 121/83 | HR 63 | Temp 98.6°F | Resp 10 | Ht 62.5 in | Wt 101.0 lb

## 2016-05-18 DIAGNOSIS — Z1212 Encounter for screening for malignant neoplasm of rectum: Secondary | ICD-10-CM | POA: Diagnosis not present

## 2016-05-18 DIAGNOSIS — R1013 Epigastric pain: Secondary | ICD-10-CM

## 2016-05-18 DIAGNOSIS — Z1211 Encounter for screening for malignant neoplasm of colon: Secondary | ICD-10-CM

## 2016-05-18 MED ORDER — SODIUM CHLORIDE 0.9 % IV SOLN: 500.0000 mL | INTRAVENOUS | Status: DC

## 2016-05-18 NOTE — Patient Instructions (Signed)
Discharge instructions given. Handout on hemorrhoids. Resume previous medications. YOU HAD AN ENDOSCOPIC PROCEDURE TODAY AT THE [facility]:   Refer to the procedure report that was given to you for any specific questions about what was found during the examination.  If the procedure report does not answer your questions, please call your gastroenterologist to clarify.  If you requested that your care partner not be given the details of your procedure findings, then the procedure report has been included in a sealed envelope for you to review at your convenience later.  YOU SHOULD EXPECT: Some feelings of bloating in the abdomen. Passage of more gas than usual.  Walking can help get rid of the air that was put into your GI tract during the procedure and reduce the bloating. If you had a lower endoscopy (such as a colonoscopy or flexible sigmoidoscopy) you may notice spotting of blood in your stool or on the toilet paper. If you underwent a bowel prep for your procedure, you may not have a normal bowel movement for a few days.  Please Note:  You might notice some irritation and congestion in your nose or some drainage.  This is from the oxygen used during your procedure.  There is no need for concern and it should clear up in a day or so.  SYMPTOMS TO REPORT IMMEDIATELY:   Following lower endoscopy (colonoscopy or flexible sigmoidoscopy):  Excessive amounts of blood in the stool  Significant tenderness or worsening of abdominal pains  Swelling of the abdomen that is new, acute  Fever of 100F or higher   Following upper endoscopy (EGD)  Vomiting of blood or coffee ground material  New chest pain or pain under the shoulder blades  Painful or persistently difficult swallowing  New shortness of breath  Fever of 100F or higher  Black, tarry-looking stools  For urgent or emergent issues, a gastroenterologist can be reached at any hour by calling [phone].   DIET:  We do  recommend a small meal at first, but then you may proceed to your regular diet.  Drink plenty of fluids but you should avoid alcoholic beverages for 24 hours.  ACTIVITY:  You should plan to take it easy for the rest of today and you should NOT DRIVE or use heavy machinery until tomorrow (because of the sedation medicines used during the test).    FOLLOW UP: Our staff will call the number listed on your records the next business day following your procedure to check on you and address any questions or concerns that you may have regarding the information given to you following your procedure. If we do not reach you, we will leave a message.  However, if you are feeling well and you are not experiencing any problems, there is no need to return our call.  We will assume that you have returned to your regular daily activities without incident.  If any biopsies were taken you will be contacted by phone or by letter within the next 1-3 weeks.  Please call us at [phone] if you have not heard about the biopsies in 3 weeks.    SIGNATURES/CONFIDENTIALITY: You and/or your care partner have signed paperwork which will be entered into your electronic medical record.  These signatures attest to the fact that that the information above on your After Visit Summary has been reviewed and is understood.  Full responsibility of the confidentiality of this discharge information lies with you and/or your care-partner.

## 2016-05-18 NOTE — Op Note (Signed)
[facility] Patient Name: Kayla Guerra Procedure Date: 05/18/2016 10:55 AM MRN: [id number] Endoscopist: Wilhemina Bonito. Marina Goodell , MD Age: 61 Referring MD:  Date of Birth: September 27, 1955 Gender: Female Account #: [id number] Procedure:                Upper GI endoscopy Indications:              Epigastric abdominal pain Medicines:                Monitored Anesthesia Care Procedure:                Pre-Anesthesia Assessment:                           - Prior to the procedure, a History and Physical                            was performed, and patient medications and                            allergies were reviewed. The patient's tolerance of                            previous anesthesia was also reviewed. The risks                            and benefits of the procedure and the sedation                            options and risks were discussed with the patient.                            All questions were answered, and informed consent                            was obtained. Prior Anticoagulants: The patient has                            taken no previous anticoagulant or antiplatelet                            agents. ASA Grade Assessment: II - A patient with                            mild systemic disease. After reviewing the risks                            and benefits, the patient was deemed in                            satisfactory condition to undergo the procedure.                           After obtaining informed consent, the endoscope was  passed under direct vision. Throughout the                            procedure, the patient's blood pressure, pulse, and                            oxygen saturations were monitored continuously. The                            Model GIF-[id number]) scope was introduced                            through the mouth, and advanced to the second part                            of duodenum. The upper GI  endoscopy was                            accomplished without difficulty. The patient                            tolerated the procedure well. Scope In: Scope Out: Findings:                 The esophagus was normal.                           The stomach was normal.                           The examined duodenum was normal.                           The cardia and gastric fundus were normal on                            retroflexion. Complications:            No immediate complications. Estimated Blood Loss:     Estimated blood loss: none. Impression:               - Normal esophagus.                           - Normal stomach.                           - Normal examined duodenum.                           - No specimens collected. Recommendation:           - Patient has a contact number available for                            emergencies. The signs and symptoms of potential  delayed complications were discussed with the                            patient. Return to normal activities tomorrow.                            Written discharge instructions were provided to the                            patient.                           - Resume previous diet.                           - Continue present medications.                           - Okay to use Prilosec OTC on demand for epigastric                            discomfort Medford Staheli N. Marina Goodell, MD 05/18/2016 11:28:56 AM This report has been signed electronically.

## 2016-05-18 NOTE — Op Note (Signed)
[facility] Patient Name: Kayla Guerra Procedure Date: 05/18/2016 10:57 AM MRN: [id number] Endoscopist: Wilhemina Bonito. Marina Goodell , MD Age: 60 Referring MD:  Date of Birth: [birth year] Gender: Female Account #: [id number] Procedure:                Colonoscopy Indications:              Screening for colorectal malignant neoplasm.                            Negative index exam October 2007 Medicines:                Monitored Anesthesia Care Procedure:                Pre-Anesthesia Assessment:                           - Prior to the procedure, a History and Physical                            was performed, and patient medications and                            allergies were reviewed. The patient's tolerance of                            previous anesthesia was also reviewed. The risks                            and benefits of the procedure and the sedation                            options and risks were discussed with the patient.                            All questions were answered, and informed consent                            was obtained. Prior Anticoagulants: The patient has                            taken no previous anticoagulant or antiplatelet                            agents. ASA Grade Assessment: II - A patient with                            mild systemic disease. After reviewing the risks                            and benefits, the patient was deemed in                            satisfactory condition to undergo the procedure.  After obtaining informed consent, the colonoscope                            was passed under direct vision. Throughout the                            procedure, the patient's blood pressure, pulse, and                            oxygen saturations were monitored continuously. The                            Colonoscope was introduced through the anus and                            advanced to the the cecum,  identified by                            appendiceal orifice and ileocecal valve. The                            ileocecal valve, appendiceal orifice, and rectum                            were photographed. The quality of the bowel                            preparation was excellent. The colonoscopy was                            performed without difficulty. The patient tolerated                            the procedure well. The bowel preparation used was                            SUPREP. Scope In: 11:06:55 AM Scope Out: 11:19:59 AM Scope Withdrawal Time: 0 hours 9 minutes 43 seconds  Total Procedure Duration: 0 hours 13 minutes 4 seconds  Findings:                 Internal hemorrhoids were found during                            retroflexion. The hemorrhoids were small.                           The exam was otherwise without abnormality on                            direct and retroflexion views. Complications:            No immediate complications. Estimated blood loss:                            None. Estimated Blood Loss:  Estimated blood loss: none. Impression:               - Internal hemorrhoids.                           - The examination was otherwise normal on direct                            and retroflexion views.                           - No specimens collected. Recommendation:           - Repeat colonoscopy in 10 years for screening                            purposes.                           - Patient has a contact number available for                            emergencies. The signs and symptoms of potential                            delayed complications were discussed with the                            patient. Return to normal activities tomorrow.                            Written discharge instructions were provided to the                            patient.                           - Resume previous diet.                           - Continue  present medications.                           - EGD today. Please see report Wilhemina Bonito. Marina Goodell, MD 05/18/2016 11:26:38 AM This report has been signed electronically.

## 2016-05-18 NOTE — Progress Notes (Signed)
Report given to PACU, vss 

## 2016-05-19 ENCOUNTER — Telehealth: Payer: Self-pay | Admitting: *Deleted

## 2016-05-19 NOTE — Telephone Encounter (Signed)
  Follow up Call-  Call back number 05/18/2016  Post procedure Call Back phone  # [phone]  Permission to leave phone message Yes  Some recent data might be hidden    [facility]

## 2016-05-19 NOTE — Telephone Encounter (Signed)
  Follow up Call-  Call back number 05/18/2016  Post procedure Call Back phone  # [phone]  Permission to leave phone message Yes  Some recent data might be hidden    [facility] 

## 2016-08-24 ENCOUNTER — Other Ambulatory Visit: Payer: Self-pay | Admitting: Internal Medicine

## 2016-08-24 DIAGNOSIS — Z1231 Encounter for screening mammogram for malignant neoplasm of breast: Secondary | ICD-10-CM

## 2016-09-21 ENCOUNTER — Ambulatory Visit
Admission: RE | Admit: 2016-09-21 | Discharge: 2016-09-21 | Disposition: A | Discharge status: Home / Self Care | Payer: BLUE CROSS/BLUE SHIELD | Source: Ambulatory Visit | Attending: Internal Medicine | Admitting: Internal Medicine

## 2016-09-21 DIAGNOSIS — Z1231 Encounter for screening mammogram for malignant neoplasm of breast: Secondary | ICD-10-CM

## 2016-11-09 DIAGNOSIS — N390 Urinary tract infection, site not specified: Secondary | ICD-10-CM | POA: Diagnosis not present

## 2016-11-09 DIAGNOSIS — R300 Dysuria: Secondary | ICD-10-CM | POA: Diagnosis not present

## 2016-11-14 DIAGNOSIS — Z23 Encounter for immunization: Secondary | ICD-10-CM | POA: Diagnosis not present

## 2016-11-19 DIAGNOSIS — Z01419 Encounter for gynecological examination (general) (routine) without abnormal findings: Secondary | ICD-10-CM | POA: Diagnosis not present

## 2016-11-19 DIAGNOSIS — Z1211 Encounter for screening for malignant neoplasm of colon: Secondary | ICD-10-CM | POA: Diagnosis not present

## 2016-11-19 DIAGNOSIS — Z1231 Encounter for screening mammogram for malignant neoplasm of breast: Secondary | ICD-10-CM | POA: Diagnosis not present

## 2016-11-19 DIAGNOSIS — Z681 Body mass index (BMI) 19 or less, adult: Secondary | ICD-10-CM | POA: Diagnosis not present

## 2016-12-11 DIAGNOSIS — N390 Urinary tract infection, site not specified: Secondary | ICD-10-CM | POA: Diagnosis not present

## 2016-12-18 DIAGNOSIS — L738 Other specified follicular disorders: Secondary | ICD-10-CM | POA: Diagnosis not present

## 2016-12-18 DIAGNOSIS — D1801 Hemangioma of skin and subcutaneous tissue: Secondary | ICD-10-CM | POA: Diagnosis not present

## 2016-12-18 DIAGNOSIS — D225 Melanocytic nevi of trunk: Secondary | ICD-10-CM | POA: Diagnosis not present

## 2016-12-18 DIAGNOSIS — D2272 Melanocytic nevi of left lower limb, including hip: Secondary | ICD-10-CM | POA: Diagnosis not present

## 2017-04-09 DIAGNOSIS — R82998 Other abnormal findings in urine: Secondary | ICD-10-CM | POA: Diagnosis not present

## 2017-04-09 DIAGNOSIS — I10 Essential (primary) hypertension: Secondary | ICD-10-CM | POA: Diagnosis not present

## 2017-04-09 DIAGNOSIS — M859 Disorder of bone density and structure, unspecified: Secondary | ICD-10-CM | POA: Diagnosis not present

## 2017-04-09 DIAGNOSIS — Z0000 Encounter for general adult medical examination without abnormal findings: Secondary | ICD-10-CM | POA: Diagnosis not present

## 2017-06-07 ENCOUNTER — Encounter (HOSPITAL_BASED_OUTPATIENT_CLINIC_OR_DEPARTMENT_OTHER): Payer: Self-pay | Admitting: *Deleted

## 2017-06-07 ENCOUNTER — Emergency Department (HOSPITAL_BASED_OUTPATIENT_CLINIC_OR_DEPARTMENT_OTHER): Payer: BLUE CROSS/BLUE SHIELD

## 2017-06-07 ENCOUNTER — Emergency Department (HOSPITAL_BASED_OUTPATIENT_CLINIC_OR_DEPARTMENT_OTHER)
Admission: EM | Admit: 2017-06-07 | Discharge: 2017-06-07 | Disposition: A | Discharge status: Home / Self Care | Payer: BLUE CROSS/BLUE SHIELD | Attending: Emergency Medicine | Admitting: Emergency Medicine

## 2017-06-07 ENCOUNTER — Other Ambulatory Visit: Payer: Self-pay

## 2017-06-07 DIAGNOSIS — R51 Headache: Secondary | ICD-10-CM | POA: Diagnosis not present

## 2017-06-07 DIAGNOSIS — G4489 Other headache syndrome: Secondary | ICD-10-CM | POA: Diagnosis not present

## 2017-06-07 DIAGNOSIS — Z79899 Other long term (current) drug therapy: Secondary | ICD-10-CM | POA: Diagnosis not present

## 2017-06-07 DIAGNOSIS — I10 Essential (primary) hypertension: Secondary | ICD-10-CM | POA: Diagnosis not present

## 2017-06-07 DIAGNOSIS — H539 Unspecified visual disturbance: Secondary | ICD-10-CM | POA: Diagnosis not present

## 2017-06-07 DIAGNOSIS — R29810 Facial weakness: Secondary | ICD-10-CM | POA: Diagnosis not present

## 2017-06-07 LAB — CBC WITH DIFFERENTIAL/PLATELET
BASOS PCT: 0 %
Basophils Absolute: 0 10*3/uL (ref 0.0–0.1)
EOS ABS: 0.2 10*3/uL (ref 0.0–0.7)
EOS PCT: 4 %
HCT: 34.7 % — ABNORMAL LOW (ref 36.0–46.0)
Hemoglobin: 11.9 g/dL — ABNORMAL LOW (ref 12.0–15.0)
LYMPHS ABS: 1.6 10*3/uL (ref 0.7–4.0)
Lymphocytes Relative: 37 %
MCH: 30.4 pg (ref 26.0–34.0)
MCHC: 34.3 g/dL (ref 30.0–36.0)
MCV: 88.5 fL (ref 78.0–100.0)
Monocytes Absolute: 0.3 10*3/uL (ref 0.1–1.0)
Monocytes Relative: 7 %
Neutro Abs: 2.2 10*3/uL (ref 1.7–7.7)
Neutrophils Relative %: 52 %
PLATELETS: 151 10*3/uL (ref 150–400)
RBC: 3.92 MIL/uL (ref 3.87–5.11)
RDW: 12.8 % (ref 11.5–15.5)
WBC: 4.3 10*3/uL (ref 4.0–10.5)

## 2017-06-07 LAB — URINALYSIS, ROUTINE W REFLEX MICROSCOPIC
BILIRUBIN URINE: NEGATIVE
Glucose, UA: NEGATIVE mg/dL
HGB URINE DIPSTICK: NEGATIVE
Ketones, ur: NEGATIVE mg/dL
Leukocytes, UA: NEGATIVE
Nitrite: NEGATIVE
PH: 6 (ref 5.0–8.0)
Protein, ur: NEGATIVE mg/dL
Specific Gravity, Urine: 1.01 (ref 1.005–1.030)

## 2017-06-07 LAB — TROPONIN I: Troponin I: <0.03 ng/mL (ref <0.03)

## 2017-06-07 LAB — COMPREHENSIVE METABOLIC PANEL
ALBUMIN: 4.3 g/dL (ref 3.5–5.0)
ALT: 14 U/L (ref 14–54)
ANION GAP: 8 (ref 5–15)
AST: 20 U/L (ref 15–41)
Alkaline Phosphatase: 54 U/L (ref 38–126)
BUN: 14 mg/dL (ref 6–20)
CHLORIDE: 103 mmol/L (ref 101–111)
CO2: 25 mmol/L (ref 22–32)
CREATININE: 0.5 mg/dL (ref 0.44–1.00)
Calcium: 9.1 mg/dL (ref 8.9–10.3)
GFR calc non Af Amer: >60 mL/min (ref >60)
GLUCOSE: 104 mg/dL — AB (ref 65–99)
Potassium: 3.3 mmol/L — ABNORMAL LOW (ref 3.5–5.1)
SODIUM: 136 mmol/L (ref 135–145)
Total Bilirubin: 0.5 mg/dL (ref 0.3–1.2)
Total Protein: 6.7 g/dL (ref 6.5–8.1)

## 2017-06-07 LAB — RAPID URINE DRUG SCREEN, HOSP PERFORMED
AMPHETAMINES: NOT DETECTED
Barbiturates: NOT DETECTED
Benzodiazepines: POSITIVE — AB
COCAINE: NOT DETECTED
Opiates: NOT DETECTED
TETRAHYDROCANNABINOL: NOT DETECTED

## 2017-06-07 LAB — ETHANOL: Alcohol, Ethyl (B): <10 mg/dL (ref <10)

## 2017-06-07 MED ORDER — METOCLOPRAMIDE HCL 10 MG PO TABS: 10.0000 mg | ORAL_TABLET | Freq: Four times a day (QID) | ORAL | 0 refills | Status: DC | PRN

## 2017-06-07 MED ORDER — METOCLOPRAMIDE HCL 5 MG/ML IJ SOLN
10.0000 mg | Freq: Once | INTRAMUSCULAR | Status: AC
  Administered 2017-06-07: 10 mg via INTRAVENOUS
  Filled 2017-06-07: qty 2

## 2017-06-07 MED ORDER — KETOROLAC TROMETHAMINE 30 MG/ML IJ SOLN
30.0000 mg | Freq: Once | INTRAMUSCULAR | Status: AC
Start: 2017-06-07 — End: 2017-06-07
  Administered 2017-06-07: 30 mg via INTRAVENOUS
  Filled 2017-06-07: qty 1

## 2017-06-07 MED ORDER — METHYLPREDNISOLONE SODIUM SUCC 125 MG IJ SOLR
125.0000 mg | Freq: Once | INTRAMUSCULAR | Status: AC
  Administered 2017-06-07: 125 mg via INTRAVENOUS
  Filled 2017-06-07: qty 2

## 2017-06-07 MED ORDER — IBUPROFEN 600 MG PO TABS: 600.0000 mg | ORAL_TABLET | Freq: Four times a day (QID) | ORAL | 0 refills | Status: DC | PRN

## 2017-06-07 MED ORDER — IOPAMIDOL (ISOVUE-370) INJECTION 76%
100.0000 mL | Freq: Once | INTRAVENOUS | Status: AC | PRN
  Administered 2017-06-07: 100 mL via INTRAVENOUS

## 2017-06-07 NOTE — ED Notes (Signed)
Pt ambulatory with steady gait to d/c window. D/c home with family. Directed to pharmacy to pick up Rx

## 2017-06-07 NOTE — ED Notes (Signed)
Paged Neuro/Hospitalist via [facility] @ 2:23pm

## 2017-06-07 NOTE — ED Provider Notes (Signed)
[facility] EMERGENCY DEPARTMENT Provider Note   CSN: [id number] Arrival date & time: 06/07/17  1243     History   Chief Complaint Chief Complaint  Patient presents with  . Numbness    HPI Kayla Guerra is a 62 y.o. female.  HPI Patient with history of migraines presents with left lower facial droop which she noticed this morning upon waking.  Droop improved throughout the day.  States she is not feeling well and complains of posterior headache.  Headache started 3 days ago and was associated with spots in her vision.  No trauma.  No nausea or vomiting.  Patient has a history of hypertension but was taken off blood pressure medication by her primary physician 2 years ago.  Husband reports increased stress at job and at home worsening over the weekend. Past Medical History:  Diagnosis Date  . Epigastric abdominal pain   . GERD (gastroesophageal reflux disease)   . Headache(784.0)   . Hypertension   . PONV (postoperative nausea and vomiting)     Patient Active Problem List   Diagnosis Date Noted  . Menopause 07/17/2011    Past Surgical History:  Procedure Laterality Date  . BREAST EXCISIONAL BIOPSY Right 2003  . BREAST SURGERY Right    lump removed  . Dental Implants    . DILATION AND CURETTAGE OF UTERUS    . HYSTEROSCOPY  2006  2011  . WISDOM TOOTH EXTRACTION       OB History    Gravida  1   Para  1   Term      Preterm      AB      Living        SAB      TAB      Ectopic      Multiple      Live Births               Home Medications    Prior to Admission medications   Medication Sig Start Date End Date Taking? Authorizing Provider  ALPRAZolam Prudy Feeler(XANAX) 0.5 MG tablet Take 1 tablet by mouth as needed. 11/04/15  Yes [provider]  eletriptan (RELPAX) 40 MG tablet Take 1 tablet by mouth as needed. 10/01/15  Yes [provider]  ibuprofen (ADVIL,MOTRIN) 600 MG tablet Take 1 tablet (600 mg total) by mouth every 6  (six) hours as needed. 06/07/17   Loren RacerYelverton, Rupal Childress, MD  metoCLOPramide (REGLAN) 10 MG tablet Take 1 tablet (10 mg total) by mouth every 6 (six) hours as needed for nausea (headache). 06/07/17   Loren RacerYelverton, Gertrude Tarbet, MD  omeprazole (PRILOSEC) 40 MG capsule Take 40 mg by mouth daily.    [provider]  Vitamin D, Ergocalciferol, (DRISDOL) 50000 units CAPS capsule Take 1 capsule by mouth every 7 (seven) days.  11/23/15   [provider]    Family History Family History  Problem Relation Age of Onset  . Heart disease Mother   . Hypertension Mother   . Thyroid disease Mother   . Breast cancer Mother [id number]  . Asthma Father   . Breast cancer Cousin 30  . Stroke Maternal Grandmother   . ADD / ADHD Son   . Other Son        mentally handicaped  . Heart disease Brother   . Colon cancer Cousin   . Colon cancer Cousin     Social History Social History   Tobacco Use  . Smoking status: Never Smoker  .  Smokeless tobacco: Never Used  Substance Use Topics  . Alcohol use: Yes    Comment: occasional  . Drug use: No     Allergies   Tetracyclines & related; Penicillins; and Erythromycin   Review of Systems Review of Systems  Constitutional: Positive for fatigue. Negative for chills and fever.  HENT: Negative for congestion and trouble swallowing.   Eyes: Positive for visual disturbance. Negative for photophobia.  Respiratory: Negative for cough and shortness of breath.   Cardiovascular: Negative for chest pain and palpitations.  Gastrointestinal: Negative for abdominal pain, constipation, diarrhea, nausea and vomiting.  Genitourinary: Negative for dysuria, flank pain and frequency.  Musculoskeletal: Negative for arthralgias, back pain, myalgias, neck pain and neck stiffness.  Skin: Negative for rash and wound.  Neurological: Positive for facial asymmetry and headaches. Negative for syncope, speech difficulty, weakness, light-headedness and numbness.  All other systems reviewed  and are negative.    Physical Exam Updated Vital Signs BP (!) 143/99   Pulse 76   Temp (!) 97.5 F (36.4 C)   Resp 12   Ht 5' 2.5" (1.588 m)   Wt 46.3 kg (102 lb)   LMP 07/24/2010   SpO2 99%   BMI 18.36 kg/m   Physical Exam  Constitutional: She is oriented to person, place, and time. She appears well-developed and well-nourished. No distress.  HENT:  Head: Normocephalic and atraumatic.  Mouth/Throat: Oropharynx is clear and moist. No oropharyngeal exudate.  Eyes: Pupils are equal, round, and reactive to light. EOM are normal.  Neck: Normal range of motion. Neck supple.  No meningismus  Cardiovascular: Normal rate and regular rhythm. Exam reveals no gallop and no friction rub.  No murmur heard. Pulmonary/Chest: Effort normal and breath sounds normal. No stridor. No respiratory distress. She has no wheezes. She has no rales. She exhibits no tenderness.  Abdominal: Soft. Bowel sounds are normal. There is no tenderness. There is no rebound and no guarding.  Musculoskeletal: Normal range of motion. She exhibits no edema or tenderness.  Lymphadenopathy:    She has no cervical adenopathy.  Neurological: She is alert and oriented to person, place, and time.  Patient is alert and oriented x3 with clear, goal oriented speech.  Cranial nerves II through XII intact.  Patient has 5/5 motor in all extremities. Sensation is intact to light touch. Bilateral finger-to-nose is normal with no signs of dysmetria. Patient has a normal gait and walks without assistance.  Skin: Skin is warm and dry. No rash noted. She is not diaphoretic. No erythema.  Psychiatric: Her behavior is normal.  Anxious appearing  Nursing note and vitals reviewed.    ED Treatments / Results  Labs (all labs ordered are listed, but only abnormal results are displayed) Labs Reviewed  CBC WITH DIFFERENTIAL/PLATELET - Abnormal; Notable for the following components:      Result Value   Hemoglobin 11.9 (*)    HCT 34.7  (*)    All other components within normal limits  COMPREHENSIVE METABOLIC PANEL - Abnormal; Notable for the following components:   Potassium 3.3 (*)    Glucose, Bld 104 (*)    All other components within normal limits  RAPID URINE DRUG SCREEN, HOSP PERFORMED - Abnormal; Notable for the following components:   Benzodiazepines POSITIVE (*)    All other components within normal limits  ETHANOL  TROPONIN I  URINALYSIS, ROUTINE W REFLEX MICROSCOPIC    EKG None  Radiology Ct Angio Head W Or Wo Contrast  Result Date: 06/07/2017 CLINICAL  DATA:  Headache blurred vision EXAM: CT ANGIOGRAPHY HEAD AND NECK TECHNIQUE: Multidetector CT imaging of the head and neck was performed using the standard protocol during bolus administration of intravenous contrast. Multiplanar CT image reconstructions and MIPs were obtained to evaluate the vascular anatomy. Carotid stenosis measurements (when applicable) are obtained utilizing NASCET criteria, using the distal internal carotid diameter as the denominator. CONTRAST:  ISOVUE-370 IOPAMIDOL (ISOVUE-370) INJECTION 76% COMPARISON:  CT head 06/07/2017 FINDINGS: CTA NECK FINDINGS Aortic arch: Normal aortic arch. Proximal great vessels widely patent. Right carotid system: Normal right carotid system without stenosis or atherosclerotic disease Left carotid system: Normal left carotid without stenosis or atherosclerotic disease Vertebral arteries: Right vertebral artery widely patent the basilar. Mild stenosis at the origin of the left vertebral artery otherwise widely patent Skeleton: No acute skeletal abnormality. Other neck: 5 mm left thyroid nodule. No mass or adenopathy in the neck. Upper chest: Negative Review of the MIP images confirms the above findings CTA HEAD FINDINGS Anterior circulation: Cavernous carotid widely patent bilaterally without stenosis. Anterior and middle cerebral arteries widely patent bilaterally. Hypoplastic right A1 segment. Both anterior  cerebral arteries supplied from the left. Posterior circulation: Both vertebral arteries patent to the basilar. Basilar widely patent. PICA, superior cerebellar, and posterior cerebral arteries patent bilaterally. Venous sinuses: Patent Anatomic variants: None Delayed phase: Normal enhancement Review of the MIP images confirms the above findings IMPRESSION: No significant carotid stenosis. Mild stenosis at the origin of the left vertebral artery. Right vertebral artery widely patent No significant intracranial stenosis. Negative for large vessel occlusion. Electronically Signed   By: Marlan Palau M.D.   On: 06/07/2017 15:37   Ct Head Wo Contrast  Result Date: 06/07/2017 CLINICAL DATA:  Headache, blurred vision. EXAM: CT HEAD WITHOUT CONTRAST TECHNIQUE: Contiguous axial images were obtained from the base of the skull through the vertex without intravenous contrast. COMPARISON:  MRI of December 08, 2008. FINDINGS: Brain: No evidence of acute infarction, hemorrhage, hydrocephalus, extra-axial collection or mass lesion/mass effect. Vascular: No hyperdense vessel or unexpected calcification. Skull: Normal. Negative for fracture or focal lesion. Sinuses/Orbits: No acute finding. Other: None. IMPRESSION: Normal head CT. Electronically Signed   By: Lupita Raider, M.D.   On: 06/07/2017 13:25   Ct Angio Neck W And/or Wo Contrast  Result Date: 06/07/2017 CLINICAL DATA:  Headache blurred vision EXAM: CT ANGIOGRAPHY HEAD AND NECK TECHNIQUE: Multidetector CT imaging of the head and neck was performed using the standard protocol during bolus administration of intravenous contrast. Multiplanar CT image reconstructions and MIPs were obtained to evaluate the vascular anatomy. Carotid stenosis measurements (when applicable) are obtained utilizing NASCET criteria, using the distal internal carotid diameter as the denominator. CONTRAST:  ISOVUE-370 IOPAMIDOL (ISOVUE-370) INJECTION 76% COMPARISON:  CT head 06/07/2017  FINDINGS: CTA NECK FINDINGS Aortic arch: Normal aortic arch. Proximal great vessels widely patent. Right carotid system: Normal right carotid system without stenosis or atherosclerotic disease Left carotid system: Normal left carotid without stenosis or atherosclerotic disease Vertebral arteries: Right vertebral artery widely patent the basilar. Mild stenosis at the origin of the left vertebral artery otherwise widely patent Skeleton: No acute skeletal abnormality. Other neck: 5 mm left thyroid nodule. No mass or adenopathy in the neck. Upper chest: Negative Review of the MIP images confirms the above findings CTA HEAD FINDINGS Anterior circulation: Cavernous carotid widely patent bilaterally without stenosis. Anterior and middle cerebral arteries widely patent bilaterally. Hypoplastic right A1 segment. Both anterior cerebral arteries supplied from the left. Posterior circulation:  Both vertebral arteries patent to the basilar. Basilar widely patent. PICA, superior cerebellar, and posterior cerebral arteries patent bilaterally. Venous sinuses: Patent Anatomic variants: None Delayed phase: Normal enhancement Review of the MIP images confirms the above findings IMPRESSION: No significant carotid stenosis. Mild stenosis at the origin of the left vertebral artery. Right vertebral artery widely patent No significant intracranial stenosis. Negative for large vessel occlusion. Electronically Signed   By: Marlan Palau M.D.   On: 06/07/2017 15:37    Procedures Procedures (including critical care time)  Medications Ordered in ED Medications  ketorolac (TORADOL) 30 MG/ML injection 30 mg (30 mg Intravenous Given 06/07/17 1405)  methylPREDNISolone sodium succinate (SOLU-MEDROL) 125 mg/2 mL injection 125 mg (125 mg Intravenous Given 06/07/17 1410)  metoCLOPramide (REGLAN) injection 10 mg (10 mg Intravenous Given 06/07/17 1411)  iopamidol (ISOVUE-370) 76 % injection 100 mL (100 mLs Intravenous Contrast Given 06/07/17 1502)      Initial Impression / Assessment and Plan / ED Course  I have reviewed the triage vital signs and the nursing notes.  Pertinent labs & imaging results that were available during my care of the patient were reviewed by me and considered in my medical decision making (see chart for details).    CT head without acute findings.  Discussed with neurologist, Dr. Christell Constant.  Recommends CT angios head and neck.  If negative, believes the patient be discharged to follow-up with neurology as outpatient.  Patient given IV treatment for migraine.  States his headache has significantly improved.  Blood pressure is trending down.  CT angios head and neck without wheezes or aneurysmal findings.  Patient continues to feel much better.  Given very strict return precautions. Final Clinical Impressions(s) / ED Diagnoses   Final diagnoses:  Facial droop  Other headache syndrome    ED Discharge Orders        Ordered    metoCLOPramide (REGLAN) 10 MG tablet  Every 6 hours PRN     06/07/17 1543    ibuprofen (ADVIL,MOTRIN) 600 MG tablet  Every 6 hours PRN     06/07/17 1543       Loren Racer, MD 06/07/17 1547

## 2017-06-07 NOTE — ED Notes (Signed)
Patient transported to CT 

## 2017-06-07 NOTE — ED Notes (Signed)
ED Provider at bedside. 

## 2017-06-07 NOTE — ED Triage Notes (Signed)
3 days ago she had a headache and blurred vision. She woke this am and felt numbness to the left side of her face that resolved. She took a Xanax.

## 2017-07-05 DIAGNOSIS — I10 Essential (primary) hypertension: Secondary | ICD-10-CM | POA: Diagnosis not present

## 2017-07-05 DIAGNOSIS — N390 Urinary tract infection, site not specified: Secondary | ICD-10-CM | POA: Diagnosis not present

## 2017-07-05 DIAGNOSIS — G4709 Other insomnia: Secondary | ICD-10-CM | POA: Diagnosis not present

## 2017-07-05 DIAGNOSIS — K5909 Other constipation: Secondary | ICD-10-CM | POA: Diagnosis not present

## 2017-07-05 DIAGNOSIS — R300 Dysuria: Secondary | ICD-10-CM | POA: Diagnosis not present

## 2017-07-21 DIAGNOSIS — G250 Essential tremor: Secondary | ICD-10-CM | POA: Diagnosis not present

## 2017-07-21 DIAGNOSIS — G43019 Migraine without aura, intractable, without status migrainosus: Secondary | ICD-10-CM | POA: Diagnosis not present

## 2017-08-05 DIAGNOSIS — R300 Dysuria: Secondary | ICD-10-CM | POA: Diagnosis not present

## 2017-08-13 ENCOUNTER — Other Ambulatory Visit: Payer: Self-pay | Admitting: Internal Medicine

## 2017-08-13 DIAGNOSIS — Z1231 Encounter for screening mammogram for malignant neoplasm of breast: Secondary | ICD-10-CM

## 2017-08-27 DIAGNOSIS — N390 Urinary tract infection, site not specified: Secondary | ICD-10-CM | POA: Diagnosis not present

## 2017-08-27 DIAGNOSIS — R300 Dysuria: Secondary | ICD-10-CM | POA: Diagnosis not present

## 2017-09-24 ENCOUNTER — Ambulatory Visit: Payer: BLUE CROSS/BLUE SHIELD

## 2017-10-07 DIAGNOSIS — I10 Essential (primary) hypertension: Secondary | ICD-10-CM | POA: Diagnosis not present

## 2017-10-12 ENCOUNTER — Ambulatory Visit
Admission: RE | Admit: 2017-10-12 | Discharge: 2017-10-12 | Disposition: A | Discharge status: Home / Self Care | Payer: BLUE CROSS/BLUE SHIELD | Source: Ambulatory Visit | Attending: Internal Medicine | Admitting: Internal Medicine

## 2017-10-12 DIAGNOSIS — Z1231 Encounter for screening mammogram for malignant neoplasm of breast: Secondary | ICD-10-CM | POA: Diagnosis not present

## 2017-10-15 DIAGNOSIS — I10 Essential (primary) hypertension: Secondary | ICD-10-CM | POA: Diagnosis not present

## 2017-10-23 DIAGNOSIS — Z23 Encounter for immunization: Secondary | ICD-10-CM | POA: Diagnosis not present

## 2017-11-30 DIAGNOSIS — Z681 Body mass index (BMI) 19 or less, adult: Secondary | ICD-10-CM | POA: Diagnosis not present

## 2017-11-30 DIAGNOSIS — Z01419 Encounter for gynecological examination (general) (routine) without abnormal findings: Secondary | ICD-10-CM | POA: Diagnosis not present

## 2017-11-30 DIAGNOSIS — Z1231 Encounter for screening mammogram for malignant neoplasm of breast: Secondary | ICD-10-CM | POA: Diagnosis not present

## 2017-12-02 DIAGNOSIS — Z681 Body mass index (BMI) 19 or less, adult: Secondary | ICD-10-CM | POA: Diagnosis not present

## 2017-12-02 DIAGNOSIS — K5909 Other constipation: Secondary | ICD-10-CM | POA: Diagnosis not present

## 2017-12-02 DIAGNOSIS — I10 Essential (primary) hypertension: Secondary | ICD-10-CM | POA: Diagnosis not present

## 2017-12-23 DIAGNOSIS — L853 Xerosis cutis: Secondary | ICD-10-CM | POA: Diagnosis not present

## 2017-12-23 DIAGNOSIS — D1801 Hemangioma of skin and subcutaneous tissue: Secondary | ICD-10-CM | POA: Diagnosis not present

## 2017-12-23 DIAGNOSIS — D225 Melanocytic nevi of trunk: Secondary | ICD-10-CM | POA: Diagnosis not present

## 2017-12-23 DIAGNOSIS — L814 Other melanin hyperpigmentation: Secondary | ICD-10-CM | POA: Diagnosis not present

## 2017-12-29 DIAGNOSIS — N390 Urinary tract infection, site not specified: Secondary | ICD-10-CM | POA: Diagnosis not present

## 2018-01-11 DIAGNOSIS — M5489 Other dorsalgia: Secondary | ICD-10-CM | POA: Diagnosis not present

## 2018-01-12 ENCOUNTER — Other Ambulatory Visit: Payer: Self-pay | Admitting: Internal Medicine

## 2018-01-12 DIAGNOSIS — R109 Unspecified abdominal pain: Secondary | ICD-10-CM

## 2018-01-26 ENCOUNTER — Ambulatory Visit
Admission: RE | Admit: 2018-01-26 | Discharge: 2018-01-26 | Disposition: A | Discharge status: Home / Self Care | Payer: BLUE CROSS/BLUE SHIELD | Source: Ambulatory Visit | Attending: Internal Medicine | Admitting: Internal Medicine

## 2018-01-26 DIAGNOSIS — K8020 Calculus of gallbladder without cholecystitis without obstruction: Secondary | ICD-10-CM | POA: Diagnosis not present

## 2018-01-26 DIAGNOSIS — R109 Unspecified abdominal pain: Secondary | ICD-10-CM

## 2018-02-02 DIAGNOSIS — K8010 Calculus of gallbladder with chronic cholecystitis without obstruction: Secondary | ICD-10-CM | POA: Diagnosis not present

## 2018-02-11 ENCOUNTER — Encounter (HOSPITAL_COMMUNITY): Payer: Self-pay

## 2018-02-11 ENCOUNTER — Other Ambulatory Visit: Payer: Self-pay

## 2018-02-11 ENCOUNTER — Encounter (HOSPITAL_COMMUNITY)
Admission: RE | Admit: 2018-02-11 | Discharge: 2018-02-11 | Disposition: A | Discharge status: Home / Self Care | Payer: BLUE CROSS/BLUE SHIELD | Source: Ambulatory Visit | Attending: Surgery | Admitting: Surgery

## 2018-02-11 DIAGNOSIS — Z01812 Encounter for preprocedural laboratory examination: Secondary | ICD-10-CM | POA: Insufficient documentation

## 2018-02-11 HISTORY — DX: Presence of spectacles and contact lenses: Z97.3

## 2018-02-11 HISTORY — DX: Other constipation: K59.09

## 2018-02-11 HISTORY — DX: Anxiety disorder, unspecified: F41.9

## 2018-02-11 LAB — CBC
HCT: 35.7 % — ABNORMAL LOW (ref 36.0–46.0)
Hemoglobin: 11.4 g/dL — ABNORMAL LOW (ref 12.0–15.0)
MCH: 29.8 pg (ref 26.0–34.0)
MCHC: 31.9 g/dL (ref 30.0–36.0)
MCV: 93.5 fL (ref 80.0–100.0)
PLATELETS: 154 10*3/uL (ref 150–400)
RBC: 3.82 MIL/uL — ABNORMAL LOW (ref 3.87–5.11)
RDW: 12.9 % (ref 11.5–15.5)
WBC: 5.5 10*3/uL (ref 4.0–10.5)
nRBC: 0 % (ref 0.0–0.2)

## 2018-02-11 LAB — BASIC METABOLIC PANEL
Anion gap: 9 (ref 5–15)
BUN: 23 mg/dL (ref 8–23)
CHLORIDE: 104 mmol/L (ref 98–111)
CO2: 25 mmol/L (ref 22–32)
CREATININE: 0.73 mg/dL (ref 0.44–1.00)
Calcium: 9 mg/dL (ref 8.9–10.3)
GFR calc Af Amer: >60 mL/min (ref >60)
GFR calc non Af Amer: >60 mL/min (ref >60)
GLUCOSE: 103 mg/dL — AB (ref 70–99)
Potassium: 3.8 mmol/L (ref 3.5–5.1)
Sodium: 138 mmol/L (ref 135–145)

## 2018-02-11 NOTE — Progress Notes (Signed)
EKG dated 06-07-2017 in epic.

## 2018-02-11 NOTE — Progress Notes (Signed)
Pt requested to speak with anesthesiologists to discuss PONV.  She reports she has previously experienced PONV, but after her last D&C Hysteroscopy 10/10/2009 sx were better controlled. No complications noted on last Anesthesia note.  Assured pt we would let anesthesiologist know and they would treat accordingly.    Janey GentaJessica Lenon Kuennen, PA-C [facility] [phone] 02/11/18 4:13 PM

## 2018-02-11 NOTE — Patient Instructions (Addendum)
Kayla RudeJoann E Mcclaskey  10/10/1955    Your procedure is scheduled on:  02-22-2018   [facility]  [facility], Report to admitting at 9:00 AM    Call this number if you have problems the morning of surgery [phone]        Remember: Do not eat food or drink liquids :After Midnight.                                        BRUSH YOUR TEETH MORNING OF SURGERY AND RINSE YOUR MOUTH OUT, NO CHEWING GUM CANDY OR MINTS.          Take these medicines the morning of surgery with A SIP OF WATER:  Xanax if needed,  Metoprolol, Omeprazole                                    You may not have any metal on your body including hair pins and               piercings  Do not wear jewelry, make-up, lotions, powders or perfumes, deodorant              Do not wear nail polish.  Do not shave  48 hours prior to surgery.               Do not bring valuables to the hospital.  IS NOT             RESPONSIBLE   FOR VALUABLES.  Contacts, dentures or bridgework may not be worn into surgery.  Leave suitcase in the car. After surgery it may be brought to your room.       Patients discharged the day of surgery will not be allowed to drive home.  Name and phone number of your driver: Husband-- Dannielle HuhDanny  #[phone]               _____________________________________________________________________             [facility] - Pre[facility] Before surgery, you can play an important role.  Because skin is not sterile, your skin needs to be as free of germs as possible.  You can reduce the number of germs on your skin by washing with CHG (chlorahexidine gluconate) soap before surgery.  CHG is an antiseptic cleaner which kills germs and bonds with the skin to continue killing germs even after washing. Please DO NOT use if you have an allergy to CHG or antibacterial soaps.  If your skin becomes reddened/irritated stop using the CHG and inform your  nurse when you arrive at Short Stay. Do not shave (including legs and underarms) for at least 48 hours prior to the first CHG shower.  You may shave your face/neck. Please follow these instructions carefully:  1.  Shower with CHG Soap the night before surgery and the  morning of Surgery.  2.  If you choose to wash your hair, wash your hair first as usual with your  normal  shampoo.  3.  After you shampoo, rinse your hair and body thoroughly to remove the  shampoo.  4.  Use CHG as you would any other liquid soap.  You can apply chg directly  to the skin and wash                       Gently with a scrungie or clean washcloth.  5.  Apply the CHG Soap to your body ONLY FROM THE NECK DOWN.   Do not use on face/ open                           Wound or open sores. Avoid contact with eyes, ears mouth and genitals (private parts).                       Wash face,  Genitals (private parts) with your normal soap.             6.  Wash thoroughly, paying special attention to the area where your surgery  will be performed.  7.  Thoroughly rinse your body with warm water from the neck down.  8.  DO NOT shower/wash with your normal soap after using and rinsing off  the CHG Soap.             9.  Pat yourself dry with a clean towel.            10.  Wear clean pajamas.            11.  Place clean sheets on your bed the night of your first shower and do not  sleep with pets. Day of Surgery : Do not apply any lotions/deodorants the morning of surgery.  Please wear clean clothes to the hospital/surgery center.  FAILURE TO FOLLOW THESE INSTRUCTIONS MAY RESULT IN THE CANCELLATION OF YOUR SURGERY PATIENT SIGNATURE_________________________________  NURSE SIGNATURE__________________________________  ________________________________________________________________________

## 2018-02-21 MED ORDER — BUPIVACAINE LIPOSOME 1.3 % IJ SUSP
20.0000 mL | Freq: Once | INTRAMUSCULAR | Status: DC
  Filled 2018-02-21: qty 20

## 2018-02-21 NOTE — H&P (Signed)
Chief Complaint:  gallstones  History of Present Illness:  Kayla Guerra is an 63 y.o. female referred by Dr. Dossie Arbour with right upper quadrant pain and gallstones.  She had an upper endoscopy by Dr. Marina Goodell that was negative.    Past Medical History:  Diagnosis Date  . Anxiety   . Chronic constipation   . GERD (gastroesophageal reflux disease)   . Headache(784.0)   . Hypertension   . PONV (postoperative nausea and vomiting)   . Wears contact lenses     Past Surgical History:  Procedure Laterality Date  . BREAST EXCISIONAL BIOPSY Right 06-26-1999    dr Luan Pulling  @[facility]   . D & C HYSTEROSCOPY WITH RESECTION POLYPS  10-10-2009    dr rivard  @[facility]   . DILATION AND CURETTAGE OF UTERUS  2006  . WISDOM TOOTH EXTRACTION      Current Facility-Administered Medications  Medication Dose Route Frequency Provider Last Rate Last Dose  . [START ON 02/22/2018] bupivacaine liposome (EXPAREL) 1.3 % injection 266 mg  20 mL Infiltration Once Luretha Murphy, MD       Current Outpatient Medications  Medication Sig Dispense Refill  . ALPRAZolam (XANAX) 0.5 MG tablet Take 0.5 mg by mouth 2 (two) times daily as needed for anxiety or sleep.     . Metoprolol Succinate 100 MG CS24 Take 50 mg by mouth 2 (two) times daily.    Marland Kitchen ibuprofen (ADVIL,MOTRIN) 600 MG tablet Take 1 tablet (600 mg total) by mouth every 6 (six) hours as needed. (Patient not taking: Reported on 02/11/2018) 30 tablet 0  . metoCLOPramide (REGLAN) 10 MG tablet Take 1 tablet (10 mg total) by mouth every 6 (six) hours as needed for nausea (headache). (Patient not taking: Reported on 02/11/2018) 30 tablet 0  . omeprazole (PRILOSEC) 40 MG capsule Take 40 mg by mouth daily.     Tetracyclines & related; Penicillins; and Erythromycin Family History  Problem Relation Age of Onset  . Heart disease Mother   . Hypertension Mother   . Thyroid disease Mother   . Breast cancer Mother 71  . Asthma Father   . Breast cancer Cousin 30  . Stroke  Maternal Grandmother   . ADD / ADHD Son   . Other Son        mentally handicaped  . Heart disease Brother   . Colon cancer Cousin   . Colon cancer Cousin    Social History:   reports that she has never smoked. She has never used smokeless tobacco. She reports current alcohol use. She reports that she does not use drugs.   REVIEW OF SYSTEMS : Negative except for see problem list  Physical Exam:   Last menstrual period 07/24/2010. There is no height or weight on file to calculate BMI.  Gen:  WDWN WF NAD  Neurological: Alert and oriented to person, place, and time. Motor and sensory function is grossly intact  Head: Normocephalic and atraumatic.  Eyes: Conjunctivae are normal. Pupils are equal, round, and reactive to light. No scleral icterus.  Neck: Normal range of motion. Neck supple. No tracheal deviation or thyromegaly present.  Cardiovascular:  SR without murmurs or gallops.  No carotid bruits Breast:  Not examined Respiratory: Effort normal.  No respiratory distress. No chest wall tenderness. Breath sounds normal.  No wheezes, rales or rhonchi.  Abdomen:  nontender at present GU:  Not examined Musculoskeletal: Normal range of motion. Extremities are nontender. No cyanosis, edema or clubbing noted Lymphadenopathy: No cervical, preauricular, postauricular  or axillary adenopathy is present Skin: Skin is warm and dry. No rash noted. No diaphoresis. No erythema. No pallor. Pscyh: Normal mood and affect. Behavior is normal. Judgment and thought content normal.   LABORATORY RESULTS: No results found for this or any previous visit (from the past 48 hour(s)).   RADIOLOGY RESULTS: No results found.  Problem List: Patient Active Problem List   Diagnosis Date Noted  . Menopause 07/17/2011    Assessment & Plan: Symptomatic gallstones    Matt B. Daphine Deutscher, MD, [facility] [phone] beeper [phone]  02/21/2018 3:14 PM

## 2018-02-22 ENCOUNTER — Encounter (HOSPITAL_COMMUNITY): Payer: Self-pay | Admitting: General Practice

## 2018-02-22 ENCOUNTER — Ambulatory Visit (HOSPITAL_COMMUNITY): Payer: BLUE CROSS/BLUE SHIELD | Admitting: Physician Assistant

## 2018-02-22 ENCOUNTER — Ambulatory Visit (HOSPITAL_COMMUNITY): Payer: BLUE CROSS/BLUE SHIELD | Admitting: Anesthesiology

## 2018-02-22 ENCOUNTER — Ambulatory Visit (HOSPITAL_COMMUNITY)
Admission: RE | Admit: 2018-02-22 | Discharge: 2018-02-22 | Disposition: A | Discharge status: Home / Self Care | Payer: BLUE CROSS/BLUE SHIELD | Attending: Surgery | Admitting: Surgery

## 2018-02-22 ENCOUNTER — Encounter (HOSPITAL_COMMUNITY)
Admission: RE | Disposition: A | Discharge status: Home / Self Care | Payer: Self-pay | Source: Home / Self Care | Attending: Surgery

## 2018-02-22 ENCOUNTER — Ambulatory Visit (HOSPITAL_COMMUNITY): Payer: BLUE CROSS/BLUE SHIELD

## 2018-02-22 DIAGNOSIS — Z9049 Acquired absence of other specified parts of digestive tract: Secondary | ICD-10-CM

## 2018-02-22 DIAGNOSIS — I10 Essential (primary) hypertension: Secondary | ICD-10-CM | POA: Diagnosis not present

## 2018-02-22 DIAGNOSIS — K8010 Calculus of gallbladder with chronic cholecystitis without obstruction: Secondary | ICD-10-CM | POA: Diagnosis not present

## 2018-02-22 DIAGNOSIS — K219 Gastro-esophageal reflux disease without esophagitis: Secondary | ICD-10-CM | POA: Diagnosis not present

## 2018-02-22 DIAGNOSIS — Z79899 Other long term (current) drug therapy: Secondary | ICD-10-CM | POA: Diagnosis not present

## 2018-02-22 DIAGNOSIS — K811 Chronic cholecystitis: Secondary | ICD-10-CM | POA: Diagnosis not present

## 2018-02-22 DIAGNOSIS — K660 Peritoneal adhesions (postprocedural) (postinfection): Secondary | ICD-10-CM | POA: Diagnosis not present

## 2018-02-22 HISTORY — PX: CHOLECYSTECTOMY: SHX55

## 2018-02-22 SURGERY — LAPAROSCOPIC CHOLECYSTECTOMY WITH INTRAOPERATIVE CHOLANGIOGRAM
Anesthesia: General | Site: Abdomen

## 2018-02-22 MED ORDER — DEXAMETHASONE SODIUM PHOSPHATE 10 MG/ML IJ SOLN
INTRAMUSCULAR | Status: DC | PRN
  Administered 2018-02-22: 5 mg via INTRAVENOUS

## 2018-02-22 MED ORDER — PROPOFOL 10 MG/ML IV BOLUS
INTRAVENOUS | Status: DC | PRN
  Administered 2018-02-22: 150 mg via INTRAVENOUS

## 2018-02-22 MED ORDER — ROCURONIUM BROMIDE 50 MG/5ML IV SOSY
PREFILLED_SYRINGE | INTRAVENOUS | Status: DC | PRN
  Administered 2018-02-22: 50 mg via INTRAVENOUS

## 2018-02-22 MED ORDER — IOPAMIDOL (ISOVUE-300) INJECTION 61%
INTRAVENOUS | Status: AC
  Filled 2018-02-22: qty 50

## 2018-02-22 MED ORDER — PROMETHAZINE HCL 25 MG/ML IJ SOLN
INTRAMUSCULAR | Status: AC
  Filled 2018-02-22: qty 1

## 2018-02-22 MED ORDER — 0.9 % SODIUM CHLORIDE (POUR BTL) OPTIME
TOPICAL | Status: DC | PRN
  Administered 2018-02-22: 1000 mL

## 2018-02-22 MED ORDER — SUGAMMADEX SODIUM 200 MG/2ML IV SOLN
INTRAVENOUS | Status: DC | PRN
  Administered 2018-02-22: 150 mg via INTRAVENOUS

## 2018-02-22 MED ORDER — CHLORHEXIDINE GLUCONATE CLOTH 2 % EX PADS: 6.0000 | MEDICATED_PAD | Freq: Once | CUTANEOUS | Status: DC

## 2018-02-22 MED ORDER — ACETAMINOPHEN 500 MG PO TABS
1000.0000 mg | ORAL_TABLET | ORAL | Status: AC
  Administered 2018-02-22: 1000 mg via ORAL
  Filled 2018-02-22: qty 2

## 2018-02-22 MED ORDER — PROMETHAZINE HCL 25 MG/ML IJ SOLN
6.2500 mg | INTRAMUSCULAR | Status: DC | PRN
  Administered 2018-02-22: 6.25 mg via INTRAVENOUS

## 2018-02-22 MED ORDER — PHENYLEPHRINE 40 MCG/ML (10ML) SYRINGE FOR IV PUSH (FOR BLOOD PRESSURE SUPPORT)
PREFILLED_SYRINGE | INTRAVENOUS | Status: DC | PRN
  Administered 2018-02-22: 80 ug via INTRAVENOUS

## 2018-02-22 MED ORDER — LACTATED RINGERS IV SOLN
INTRAVENOUS | Status: DC
  Administered 2018-02-22: 10:00:00 via INTRAVENOUS

## 2018-02-22 MED ORDER — HYDROMORPHONE HCL 1 MG/ML IJ SOLN: 0.2500 mg | INTRAMUSCULAR | Status: DC | PRN

## 2018-02-22 MED ORDER — SCOPOLAMINE 1 MG/3DAYS TD PT72
1.0000 | MEDICATED_PATCH | Freq: Once | TRANSDERMAL | Status: DC
  Administered 2018-02-22: 1.5 mg via TRANSDERMAL
  Filled 2018-02-22: qty 1

## 2018-02-22 MED ORDER — FENTANYL CITRATE (PF) 100 MCG/2ML IJ SOLN
INTRAMUSCULAR | Status: DC | PRN
  Administered 2018-02-22: 50 ug via INTRAVENOUS
  Administered 2018-02-22: 25 ug via INTRAVENOUS
  Administered 2018-02-22: 50 ug via INTRAVENOUS

## 2018-02-22 MED ORDER — FENTANYL CITRATE (PF) 100 MCG/2ML IJ SOLN
INTRAMUSCULAR | Status: AC
  Filled 2018-02-22: qty 2

## 2018-02-22 MED ORDER — BUPIVACAINE LIPOSOME 1.3 % IJ SUSP
INTRAMUSCULAR | Status: DC | PRN
  Administered 2018-02-22: 20 mL

## 2018-02-22 MED ORDER — ONDANSETRON HCL 4 MG/2ML IJ SOLN
INTRAMUSCULAR | Status: DC | PRN
  Administered 2018-02-22 (×2): 4 mg via INTRAVENOUS

## 2018-02-22 MED ORDER — MIDAZOLAM HCL 2 MG/2ML IJ SOLN
INTRAMUSCULAR | Status: AC
  Filled 2018-02-22: qty 2

## 2018-02-22 MED ORDER — PROPOFOL 10 MG/ML IV BOLUS
INTRAVENOUS | Status: AC
  Filled 2018-02-22: qty 20

## 2018-02-22 MED ORDER — ONDANSETRON HCL 4 MG/2ML IJ SOLN
INTRAMUSCULAR | Status: AC
  Filled 2018-02-22: qty 2

## 2018-02-22 MED ORDER — MIDAZOLAM HCL 5 MG/5ML IJ SOLN
INTRAMUSCULAR | Status: DC | PRN
  Administered 2018-02-22: 2 mg via INTRAVENOUS

## 2018-02-22 MED ORDER — EPHEDRINE SULFATE-NACL 50-0.9 MG/10ML-% IV SOSY
PREFILLED_SYRINGE | INTRAVENOUS | Status: DC | PRN
  Administered 2018-02-22: 10 mg via INTRAVENOUS

## 2018-02-22 MED ORDER — KETAMINE HCL 10 MG/ML IJ SOLN
INTRAMUSCULAR | Status: DC | PRN
  Administered 2018-02-22: 20 mg via INTRAVENOUS

## 2018-02-22 MED ORDER — FENTANYL CITRATE (PF) 250 MCG/5ML IJ SOLN
INTRAMUSCULAR | Status: AC
  Filled 2018-02-22: qty 5

## 2018-02-22 MED ORDER — LACTATED RINGERS IR SOLN
Status: DC | PRN
  Administered 2018-02-22: 1000 mL

## 2018-02-22 MED ORDER — MIDAZOLAM HCL 2 MG/2ML IJ SOLN: 0.5000 mg | Freq: Once | INTRAMUSCULAR | Status: DC | PRN

## 2018-02-22 MED ORDER — HYDROCODONE-ACETAMINOPHEN 5-325 MG PO TABS: 1.0000 | ORAL_TABLET | Freq: Four times a day (QID) | ORAL | 0 refills | Status: DC | PRN

## 2018-02-22 MED ORDER — CEFAZOLIN SODIUM-DEXTROSE 2-4 GM/100ML-% IV SOLN
2.0000 g | INTRAVENOUS | Status: AC
  Administered 2018-02-22: 2 g via INTRAVENOUS
  Filled 2018-02-22: qty 100

## 2018-02-22 MED ORDER — KETAMINE HCL 10 MG/ML IJ SOLN
INTRAMUSCULAR | Status: AC
  Filled 2018-02-22: qty 1

## 2018-02-22 MED ORDER — PROPOFOL 500 MG/50ML IV EMUL
INTRAVENOUS | Status: DC | PRN
  Administered 2018-02-22: 25 ug/kg/min via INTRAVENOUS

## 2018-02-22 MED ORDER — LIDOCAINE 2% (20 MG/ML) 5 ML SYRINGE
INTRAMUSCULAR | Status: DC | PRN
  Administered 2018-02-22: 1 mg/kg/h via INTRAVENOUS

## 2018-02-22 MED ORDER — LIDOCAINE 2% (20 MG/ML) 5 ML SYRINGE
INTRAMUSCULAR | Status: DC | PRN
  Administered 2018-02-22: 10 mg via INTRAVENOUS
  Administered 2018-02-22: 50 mg via INTRAVENOUS

## 2018-02-22 MED ORDER — LIDOCAINE 2% (20 MG/ML) 5 ML SYRINGE
INTRAMUSCULAR | Status: AC
  Filled 2018-02-22: qty 5

## 2018-02-22 MED ORDER — HEPARIN SODIUM (PORCINE) 5000 UNIT/ML IJ SOLN
5000.0000 [IU] | Freq: Once | INTRAMUSCULAR | Status: AC
  Administered 2018-02-22: 5000 [IU] via SUBCUTANEOUS
  Filled 2018-02-22: qty 1

## 2018-02-22 MED ORDER — ONDANSETRON HCL 4 MG/2ML IJ SOLN
4.0000 mg | Freq: Once | INTRAMUSCULAR | Status: AC
  Administered 2018-02-22: 4 mg via INTRAVENOUS

## 2018-02-22 MED ORDER — MEPERIDINE HCL 50 MG/ML IJ SOLN: 6.2500 mg | INTRAMUSCULAR | Status: DC | PRN

## 2018-02-22 MED ORDER — IOPAMIDOL (ISOVUE-300) INJECTION 61%
INTRAVENOUS | Status: DC | PRN
  Administered 2018-02-22: 2.5 mL

## 2018-02-22 MED ORDER — FENTANYL CITRATE (PF) 100 MCG/2ML IJ SOLN
25.0000 ug | INTRAMUSCULAR | Status: DC | PRN
  Administered 2018-02-22: 50 ug via INTRAVENOUS

## 2018-02-22 MED ORDER — BUPIVACAINE LIPOSOME 1.3 % IJ SUSP: 20.0000 mL | Freq: Once | INTRAMUSCULAR | Status: DC

## 2018-02-22 MED ORDER — EPHEDRINE 5 MG/ML INJ
INTRAVENOUS | Status: AC
  Filled 2018-02-22: qty 10

## 2018-02-22 MED ORDER — GABAPENTIN 300 MG PO CAPS
300.0000 mg | ORAL_CAPSULE | ORAL | Status: AC
  Administered 2018-02-22: 300 mg via ORAL
  Filled 2018-02-22: qty 1

## 2018-02-22 SURGICAL SUPPLY — 43 items
ADH SKN CLS APL DERMABOND .7 (GAUZE/BANDAGES/DRESSINGS) ×1
APL SKNCLS STERI-STRIP NONHPOA (GAUZE/BANDAGES/DRESSINGS)
APL SWBSTK 6 STRL LF DISP (MISCELLANEOUS) ×2
APPLICATOR COTTON TIP 6 STRL (MISCELLANEOUS) ×2 IMPLANT
APPLICATOR COTTON TIP 6IN STRL (MISCELLANEOUS) ×6
APPLIER CLIP ROT 10 11.4 M/L (STAPLE) ×3
APR CLP MED LRG 11.4X10 (STAPLE) ×1
BAG SPEC RTRVL 10 TROC 200 (ENDOMECHANICALS) ×1
BENZOIN TINCTURE PRP APPL 2/3 (GAUZE/BANDAGES/DRESSINGS) IMPLANT
CABLE HIGH FREQUENCY MONO STRZ (ELECTRODE) ×1 IMPLANT
CATH REDDICK CHOLANGI 4FR 50CM (CATHETERS) ×3 IMPLANT
CLIP APPLIE ROT 10 11.4 M/L (STAPLE) ×1 IMPLANT
CLOSURE WOUND 1/2 X4 (GAUZE/BANDAGES/DRESSINGS)
COVER MAYO STAND STRL (DRAPES) ×3 IMPLANT
COVER SURGICAL LIGHT HANDLE (MISCELLANEOUS) ×3 IMPLANT
COVER WAND RF STERILE (DRAPES) ×2 IMPLANT
DECANTER SPIKE VIAL GLASS SM (MISCELLANEOUS) ×3 IMPLANT
DERMABOND ADVANCED (GAUZE/BANDAGES/DRESSINGS) ×2
DERMABOND ADVANCED .7 DNX12 (GAUZE/BANDAGES/DRESSINGS) ×1 IMPLANT
DRAPE C-ARM 42X120 X-RAY (DRAPES) ×3 IMPLANT
ELECT PENCIL ROCKER SW 15FT (MISCELLANEOUS) ×2 IMPLANT
ELECT REM PT RETURN 15FT ADLT (MISCELLANEOUS) ×3 IMPLANT
GLOVE BIOGEL M 8.0 STRL (GLOVE) ×3 IMPLANT
GOWN STRL REUS W/TWL XL LVL3 (GOWN DISPOSABLE) ×9 IMPLANT
HEMOSTAT SURGICEL 4X8 (HEMOSTASIS) IMPLANT
IV CATH 14GX2 1/4 (CATHETERS) ×5 IMPLANT
KIT BASIN OR (CUSTOM PROCEDURE TRAY) ×3 IMPLANT
L-HOOK LAP DISP 36CM (ELECTROSURGICAL) ×3
LHOOK LAP DISP 36CM (ELECTROSURGICAL) IMPLANT
POUCH RETRIEVAL ECOSAC 10 (ENDOMECHANICALS) IMPLANT
POUCH RETRIEVAL ECOSAC 10MM (ENDOMECHANICALS) ×2
SCISSORS LAP 5X45 EPIX DISP (ENDOMECHANICALS) ×3 IMPLANT
SET IRRIG TUBING LAPAROSCOPIC (IRRIGATION / IRRIGATOR) ×3 IMPLANT
SLEEVE XCEL OPT CAN 5 100 (ENDOMECHANICALS) ×3 IMPLANT
STRIP CLOSURE SKIN 1/2X4 (GAUZE/BANDAGES/DRESSINGS) IMPLANT
SUT MNCRL AB 4-0 PS2 18 (SUTURE) ×7 IMPLANT
SYR 20CC LL (SYRINGE) ×3 IMPLANT
TOWEL OR 17X26 10 PK STRL BLUE (TOWEL DISPOSABLE) ×3 IMPLANT
TRAY LAPAROSCOPIC (CUSTOM PROCEDURE TRAY) ×3 IMPLANT
TROCAR BLADELESS OPT 5 100 (ENDOMECHANICALS) ×3 IMPLANT
TROCAR XCEL BLUNT TIP 100MML (ENDOMECHANICALS) IMPLANT
TROCAR XCEL NON-BLD 11X100MML (ENDOMECHANICALS) ×3 IMPLANT
TUBING INSUF HEATED (TUBING) ×3 IMPLANT

## 2018-02-22 NOTE — Interval H&P Note (Signed)
History and Physical Interval Note:  02/22/2018 12:58 PM  Kayla Guerra  has presented today for surgery, with the diagnosis of CHRONIC CHOLECYSTITIS WITH CALCULUS  The various methods of treatment have been discussed with the patient and family. After consideration of risks, benefits and other options for treatment, the patient has consented to  Procedure(s): LAPAROSCOPIC CHOLECYSTECTOMY WITH INTRAOPERATIVE CHOLANGIOGRAM ERAS PATHWAY (N/A) as a surgical intervention .  The patient's history has been reviewed, patient examined, no change in status, stable for surgery.  I have reviewed the patient's chart and labs.  Questions were answered to the patient's satisfaction.     Valarie Merino

## 2018-02-22 NOTE — Transfer of Care (Signed)
Immediate Anesthesia Transfer of Care Note  Patient: Kayla Guerra  Procedure(s) Performed: LAPAROSCOPIC CHOLECYSTECTOMY WITH INTRAOPERATIVE CHOLANGIOGRAM ERAS PATHWAY (N/A Abdomen)  Patient Location: PACU  Anesthesia Type:General  Level of Consciousness: drowsy and patient cooperative  Airway & Oxygen Therapy: Patient Spontanous Breathing and Patient connected to face mask oxygen  Post-op Assessment: Report given to RN and Post -op Vital signs reviewed and stable  Post vital signs: Reviewed and stable  Last Vitals:  Vitals Value Taken Time  BP 135/75 02/22/2018  3:15 PM  Temp    Pulse 66 02/22/2018  3:16 PM  Resp 12 02/22/2018  3:16 PM  SpO2 100 % 02/22/2018  3:16 PM  Vitals shown include unvalidated device data.  Last Pain:  Vitals:   02/22/18 0920  TempSrc: Oral  PainSc: 2          Complications: No apparent anesthesia complications

## 2018-02-22 NOTE — Anesthesia Procedure Notes (Signed)

## 2018-02-22 NOTE — Discharge Instructions (Signed)

## 2018-02-22 NOTE — Op Note (Signed)
Kayla Guerra  Primary Care Physician:  Jarome MatinPaterson, Daniel, MD    02/22/2018  2:50 PM  Procedure: Laparoscopic Cholecystectomy with intraoperative cholangiogram  Surgeon: Susy FrizzleMatt B. Daphine DeutscherMartin, MD, FACS Asst:  none  Anes:  General  Drains:  None  Findings: Chronic adhesions to the pyloric channel; chronic cholecystits with normal IOC  Description of Procedure: The patient was taken to OR 1 and given general anesthesia.  The patient was prepped with PCMX and draped sterilely. A time out was performed.  Access to the abdomen was achieved with a 5 mm Optiview through the right upper quadrant.  Port placement included three 5 mm trocars and one 11 mm in the upper midline.    The gallbladder was visualized and the fundus was grasped and the gallbladder was elevated. Numberous adhesions were noted between the liver and the duodenum and the stomach was very long and "U" shaped.   Traction on the infundibulum allowed for successful demonstration of the critical view. Inflammatory changes were chronic.  The cystic duct was identified and clipped up on the gallbladder and an incision was made in the cystic duct and the Reddick catheter was inserted after milking the cystic duct of any debris. A dynamic cholangiogram was performed which demonstrated good intrahepatic filling and free flow into the duodenum.    The cystic duct was then triple clipped and divided, the cystic artery was double clipped and divided and then the gallbladder was removed from the gallbladder bed. Removal of the gallbladder from the gallbladder bed was performed without entering it.  The gallbladder was then placed in a bag and brought out through one of the trocar sites. The gallbladder bed was inspected and no bleeding or bile leaks were seen.    Incisions were injected with Exparel and closed with 4-0 Monocryl and Dermabond on the skin.  Sponge and needle count were correct.    The patient was taken to the recovery room in  satisfactory condition.

## 2018-02-22 NOTE — Anesthesia Preprocedure Evaluation (Addendum)
Anesthesia Evaluation  Patient identified by MRN, date of birth, ID band Patient awake    Reviewed: Allergy & Precautions, NPO status , Patient's Chart, lab work & pertinent test results  History of Anesthesia Complications (+) PONV  Airway Mallampati: II  TM Distance: >3 FB Neck ROM: Full    Dental  (+) Caps, Dental Advisory Given, Implants   Pulmonary neg pulmonary ROS,    breath sounds clear to auscultation       Cardiovascular hypertension, Pt. on home beta blockers and Pt. on medications (-) angina Rhythm:Regular Rate:Normal     Neuro/Psych  Headaches, Anxiety    GI/Hepatic Neg liver ROS, GERD  Medicated,  Endo/Other  negative endocrine ROS  Renal/GU negative Renal ROS     Musculoskeletal   Abdominal   Peds  Hematology negative hematology ROS (+)   Anesthesia Other Findings   Reproductive/Obstetrics Post-menopausal                            Anesthesia Physical Anesthesia Plan  ASA: II  Anesthesia Plan: General   Post-op Pain Management:    Induction: Intravenous  PONV Risk Score and Plan: 4 or greater and Scopolamine patch - Pre-op, Dexamethasone and Ondansetron  Airway Management Planned: Oral ETT  Additional Equipment:   Intra-op Plan:   Post-operative Plan: Extubation in OR  Informed Consent: I have reviewed the patients History and Physical, chart, labs and discussed the procedure including the risks, benefits and alternatives for the proposed anesthesia with the patient or authorized representative who has indicated his/her understanding and acceptance.   Dental advisory given  Plan Discussed with: CRNA and Surgeon  Anesthesia Plan Comments: (Plan routine monitors, GETA)       Anesthesia Quick Evaluation

## 2018-02-23 ENCOUNTER — Encounter (HOSPITAL_COMMUNITY): Payer: Self-pay | Admitting: Surgery

## 2018-02-23 NOTE — Anesthesia Postprocedure Evaluation (Signed)
Anesthesia Post Note  Patient: Kayla Guerra  Procedure(s) Performed: LAPAROSCOPIC CHOLECYSTECTOMY WITH INTRAOPERATIVE CHOLANGIOGRAM ERAS PATHWAY (N/A Abdomen)     Patient location during evaluation: PACU Anesthesia Type: General Level of consciousness: awake and alert Pain management: pain level controlled Vital Signs Assessment: post-procedure vital signs reviewed and stable Respiratory status: spontaneous breathing, nonlabored ventilation, respiratory function stable and patient connected to nasal cannula oxygen Cardiovascular status: blood pressure returned to baseline and stable Postop Assessment: no apparent nausea or vomiting Anesthetic complications: no    Last Vitals:  Vitals:   02/22/18 1727 02/22/18 1748  BP: (!) 150/84 (!) 164/89  Pulse: 65 65  Resp: 17 16  Temp: 36.6 C (!) 36.3 C  SpO2: 100% 98%    Last Pain:  Vitals:   02/22/18 1748  TempSrc:   PainSc: 6                  [name] L [name]

## 2018-04-14 DIAGNOSIS — Z0000 Encounter for general adult medical examination without abnormal findings: Secondary | ICD-10-CM | POA: Diagnosis not present

## 2018-04-14 DIAGNOSIS — I10 Essential (primary) hypertension: Secondary | ICD-10-CM | POA: Diagnosis not present

## 2018-04-14 DIAGNOSIS — R82998 Other abnormal findings in urine: Secondary | ICD-10-CM | POA: Diagnosis not present

## 2018-04-14 DIAGNOSIS — M8580 Other specified disorders of bone density and structure, unspecified site: Secondary | ICD-10-CM | POA: Diagnosis not present

## 2018-04-21 DIAGNOSIS — M859 Disorder of bone density and structure, unspecified: Secondary | ICD-10-CM | POA: Diagnosis not present

## 2018-04-21 DIAGNOSIS — E7849 Other hyperlipidemia: Secondary | ICD-10-CM | POA: Diagnosis not present

## 2018-04-21 DIAGNOSIS — I10 Essential (primary) hypertension: Secondary | ICD-10-CM | POA: Diagnosis not present

## 2018-04-21 DIAGNOSIS — Z1331 Encounter for screening for depression: Secondary | ICD-10-CM | POA: Diagnosis not present

## 2018-04-21 DIAGNOSIS — Z0000 Encounter for general adult medical examination without abnormal findings: Secondary | ICD-10-CM | POA: Diagnosis not present

## 2018-04-21 DIAGNOSIS — K5909 Other constipation: Secondary | ICD-10-CM | POA: Diagnosis not present

## 2018-08-29 DIAGNOSIS — H1045 Other chronic allergic conjunctivitis: Secondary | ICD-10-CM | POA: Diagnosis not present

## 2018-09-14 ENCOUNTER — Other Ambulatory Visit: Payer: Self-pay | Admitting: Internal Medicine

## 2018-09-14 DIAGNOSIS — Z1231 Encounter for screening mammogram for malignant neoplasm of breast: Secondary | ICD-10-CM

## 2018-09-19 DIAGNOSIS — H16293 Other keratoconjunctivitis, bilateral: Secondary | ICD-10-CM | POA: Diagnosis not present

## 2018-09-19 DIAGNOSIS — H10501 Unspecified blepharoconjunctivitis, right eye: Secondary | ICD-10-CM | POA: Diagnosis not present

## 2018-09-26 DIAGNOSIS — H02889 Meibomian gland dysfunction of unspecified eye, unspecified eyelid: Secondary | ICD-10-CM | POA: Diagnosis not present

## 2018-09-26 DIAGNOSIS — H16293 Other keratoconjunctivitis, bilateral: Secondary | ICD-10-CM | POA: Diagnosis not present

## 2018-10-18 DIAGNOSIS — H16223 Keratoconjunctivitis sicca, not specified as Sjogren's, bilateral: Secondary | ICD-10-CM | POA: Diagnosis not present

## 2018-10-18 DIAGNOSIS — H0288A Meibomian gland dysfunction right eye, upper and lower eyelids: Secondary | ICD-10-CM | POA: Diagnosis not present

## 2018-10-18 DIAGNOSIS — H0288B Meibomian gland dysfunction left eye, upper and lower eyelids: Secondary | ICD-10-CM | POA: Diagnosis not present

## 2018-10-18 DIAGNOSIS — H0102A Squamous blepharitis right eye, upper and lower eyelids: Secondary | ICD-10-CM | POA: Diagnosis not present

## 2018-10-28 DIAGNOSIS — Z23 Encounter for immunization: Secondary | ICD-10-CM | POA: Diagnosis not present

## 2018-11-04 DIAGNOSIS — N390 Urinary tract infection, site not specified: Secondary | ICD-10-CM | POA: Diagnosis not present

## 2018-11-04 DIAGNOSIS — Z87440 Personal history of urinary (tract) infections: Secondary | ICD-10-CM | POA: Diagnosis not present

## 2018-11-07 ENCOUNTER — Ambulatory Visit
Admission: RE | Admit: 2018-11-07 | Discharge: 2018-11-07 | Disposition: A | Discharge status: Home / Self Care | Payer: BLUE CROSS/BLUE SHIELD | Source: Ambulatory Visit | Attending: Internal Medicine | Admitting: Internal Medicine

## 2018-11-07 ENCOUNTER — Other Ambulatory Visit: Payer: Self-pay

## 2018-11-07 DIAGNOSIS — Z1231 Encounter for screening mammogram for malignant neoplasm of breast: Secondary | ICD-10-CM

## 2018-11-28 DIAGNOSIS — H02889 Meibomian gland dysfunction of unspecified eye, unspecified eyelid: Secondary | ICD-10-CM | POA: Diagnosis not present

## 2018-11-28 DIAGNOSIS — H04129 Dry eye syndrome of unspecified lacrimal gland: Secondary | ICD-10-CM | POA: Diagnosis not present

## 2018-11-29 DIAGNOSIS — R300 Dysuria: Secondary | ICD-10-CM | POA: Diagnosis not present

## 2018-12-20 DIAGNOSIS — Z1231 Encounter for screening mammogram for malignant neoplasm of breast: Secondary | ICD-10-CM | POA: Diagnosis not present

## 2018-12-20 DIAGNOSIS — Z1211 Encounter for screening for malignant neoplasm of colon: Secondary | ICD-10-CM | POA: Diagnosis not present

## 2018-12-20 DIAGNOSIS — Z124 Encounter for screening for malignant neoplasm of cervix: Secondary | ICD-10-CM | POA: Diagnosis not present

## 2018-12-20 DIAGNOSIS — Z01419 Encounter for gynecological examination (general) (routine) without abnormal findings: Secondary | ICD-10-CM | POA: Diagnosis not present

## 2018-12-20 DIAGNOSIS — Z681 Body mass index (BMI) 19 or less, adult: Secondary | ICD-10-CM | POA: Diagnosis not present

## 2018-12-20 DIAGNOSIS — R350 Frequency of micturition: Secondary | ICD-10-CM | POA: Diagnosis not present

## 2018-12-23 ENCOUNTER — Other Ambulatory Visit: Payer: Self-pay | Admitting: Obstetrics and Gynecology

## 2018-12-23 DIAGNOSIS — R5381 Other malaise: Secondary | ICD-10-CM

## 2018-12-26 DIAGNOSIS — H02889 Meibomian gland dysfunction of unspecified eye, unspecified eyelid: Secondary | ICD-10-CM | POA: Diagnosis not present

## 2018-12-26 DIAGNOSIS — H04129 Dry eye syndrome of unspecified lacrimal gland: Secondary | ICD-10-CM | POA: Diagnosis not present

## 2018-12-29 DIAGNOSIS — N952 Postmenopausal atrophic vaginitis: Secondary | ICD-10-CM | POA: Diagnosis not present

## 2018-12-29 DIAGNOSIS — Z87440 Personal history of urinary (tract) infections: Secondary | ICD-10-CM | POA: Diagnosis not present

## 2019-01-04 DIAGNOSIS — D225 Melanocytic nevi of trunk: Secondary | ICD-10-CM | POA: Diagnosis not present

## 2019-01-04 DIAGNOSIS — L814 Other melanin hyperpigmentation: Secondary | ICD-10-CM | POA: Diagnosis not present

## 2019-01-04 DIAGNOSIS — L438 Other lichen planus: Secondary | ICD-10-CM | POA: Diagnosis not present

## 2019-01-04 DIAGNOSIS — L570 Actinic keratosis: Secondary | ICD-10-CM | POA: Diagnosis not present

## 2019-01-04 DIAGNOSIS — D1801 Hemangioma of skin and subcutaneous tissue: Secondary | ICD-10-CM | POA: Diagnosis not present

## 2019-04-24 DIAGNOSIS — Z0000 Encounter for general adult medical examination without abnormal findings: Secondary | ICD-10-CM | POA: Diagnosis not present

## 2019-04-24 DIAGNOSIS — I10 Essential (primary) hypertension: Secondary | ICD-10-CM | POA: Diagnosis not present

## 2019-04-24 DIAGNOSIS — E7849 Other hyperlipidemia: Secondary | ICD-10-CM | POA: Diagnosis not present

## 2019-04-24 DIAGNOSIS — M859 Disorder of bone density and structure, unspecified: Secondary | ICD-10-CM | POA: Diagnosis not present

## 2019-04-24 DIAGNOSIS — R82998 Other abnormal findings in urine: Secondary | ICD-10-CM | POA: Diagnosis not present

## 2019-05-01 DIAGNOSIS — E785 Hyperlipidemia, unspecified: Secondary | ICD-10-CM | POA: Diagnosis not present

## 2019-05-01 DIAGNOSIS — I10 Essential (primary) hypertension: Secondary | ICD-10-CM | POA: Diagnosis not present

## 2019-05-01 DIAGNOSIS — Z1331 Encounter for screening for depression: Secondary | ICD-10-CM | POA: Diagnosis not present

## 2019-05-01 DIAGNOSIS — Z0000 Encounter for general adult medical examination without abnormal findings: Secondary | ICD-10-CM | POA: Diagnosis not present

## 2019-05-01 DIAGNOSIS — K5909 Other constipation: Secondary | ICD-10-CM | POA: Diagnosis not present

## 2019-05-01 DIAGNOSIS — M8580 Other specified disorders of bone density and structure, unspecified site: Secondary | ICD-10-CM | POA: Diagnosis not present

## 2019-05-15 DIAGNOSIS — M8589 Other specified disorders of bone density and structure, multiple sites: Secondary | ICD-10-CM | POA: Diagnosis not present

## 2019-05-31 ENCOUNTER — Ambulatory Visit: Payer: Self-pay | Admitting: Allergy

## 2019-08-08 ENCOUNTER — Other Ambulatory Visit: Payer: Self-pay | Admitting: Internal Medicine

## 2019-08-08 DIAGNOSIS — Z1231 Encounter for screening mammogram for malignant neoplasm of breast: Secondary | ICD-10-CM

## 2019-10-03 ENCOUNTER — Other Ambulatory Visit: Payer: Self-pay | Admitting: Obstetrics and Gynecology

## 2019-10-03 DIAGNOSIS — Z803 Family history of malignant neoplasm of breast: Secondary | ICD-10-CM

## 2019-10-30 DIAGNOSIS — G479 Sleep disorder, unspecified: Secondary | ICD-10-CM | POA: Diagnosis not present

## 2019-10-30 DIAGNOSIS — I10 Essential (primary) hypertension: Secondary | ICD-10-CM | POA: Diagnosis not present

## 2019-10-30 DIAGNOSIS — L578 Other skin changes due to chronic exposure to nonionizing radiation: Secondary | ICD-10-CM | POA: Diagnosis not present

## 2019-10-30 DIAGNOSIS — M810 Age-related osteoporosis without current pathological fracture: Secondary | ICD-10-CM | POA: Diagnosis not present

## 2019-10-30 DIAGNOSIS — Z87440 Personal history of urinary (tract) infections: Secondary | ICD-10-CM | POA: Diagnosis not present

## 2019-10-30 DIAGNOSIS — Z79899 Other long term (current) drug therapy: Secondary | ICD-10-CM | POA: Diagnosis not present

## 2019-10-30 DIAGNOSIS — F418 Other specified anxiety disorders: Secondary | ICD-10-CM | POA: Diagnosis not present

## 2019-10-30 DIAGNOSIS — Z23 Encounter for immunization: Secondary | ICD-10-CM | POA: Diagnosis not present

## 2019-11-02 ENCOUNTER — Ambulatory Visit
Admission: RE | Admit: 2019-11-02 | Discharge: 2019-11-02 | Disposition: A | Discharge status: Home / Self Care | Payer: BC Managed Care – PPO | Source: Ambulatory Visit | Attending: Obstetrics and Gynecology | Admitting: Obstetrics and Gynecology

## 2019-11-02 DIAGNOSIS — Z803 Family history of malignant neoplasm of breast: Secondary | ICD-10-CM

## 2019-11-02 DIAGNOSIS — N6489 Other specified disorders of breast: Secondary | ICD-10-CM | POA: Diagnosis not present

## 2019-11-02 MED ORDER — GADOBUTROL 1 MMOL/ML IV SOLN
5.0000 mL | Freq: Once | INTRAVENOUS | Status: AC | PRN
  Administered 2019-11-02: 5 mL via INTRAVENOUS

## 2019-11-08 ENCOUNTER — Ambulatory Visit: Payer: Self-pay

## 2019-11-15 DIAGNOSIS — M810 Age-related osteoporosis without current pathological fracture: Secondary | ICD-10-CM | POA: Diagnosis not present

## 2019-11-15 DIAGNOSIS — N952 Postmenopausal atrophic vaginitis: Secondary | ICD-10-CM | POA: Diagnosis not present

## 2019-11-15 DIAGNOSIS — Z803 Family history of malignant neoplasm of breast: Secondary | ICD-10-CM | POA: Diagnosis not present

## 2019-11-16 DIAGNOSIS — M859 Disorder of bone density and structure, unspecified: Secondary | ICD-10-CM | POA: Diagnosis not present

## 2019-12-19 DIAGNOSIS — Z1231 Encounter for screening mammogram for malignant neoplasm of breast: Secondary | ICD-10-CM | POA: Diagnosis not present

## 2019-12-19 DIAGNOSIS — Z1211 Encounter for screening for malignant neoplasm of colon: Secondary | ICD-10-CM | POA: Diagnosis not present

## 2019-12-19 DIAGNOSIS — Z01419 Encounter for gynecological examination (general) (routine) without abnormal findings: Secondary | ICD-10-CM | POA: Diagnosis not present

## 2019-12-19 DIAGNOSIS — Z681 Body mass index (BMI) 19 or less, adult: Secondary | ICD-10-CM | POA: Diagnosis not present

## 2019-12-21 DIAGNOSIS — I10 Essential (primary) hypertension: Secondary | ICD-10-CM | POA: Diagnosis not present

## 2019-12-21 DIAGNOSIS — Z79899 Other long term (current) drug therapy: Secondary | ICD-10-CM | POA: Diagnosis not present

## 2019-12-21 DIAGNOSIS — M859 Disorder of bone density and structure, unspecified: Secondary | ICD-10-CM | POA: Diagnosis not present

## 2019-12-21 DIAGNOSIS — R634 Abnormal weight loss: Secondary | ICD-10-CM | POA: Diagnosis not present

## 2020-01-22 DIAGNOSIS — N952 Postmenopausal atrophic vaginitis: Secondary | ICD-10-CM | POA: Diagnosis not present

## 2020-01-22 DIAGNOSIS — Z87440 Personal history of urinary (tract) infections: Secondary | ICD-10-CM | POA: Diagnosis not present

## 2020-01-30 DIAGNOSIS — D1801 Hemangioma of skin and subcutaneous tissue: Secondary | ICD-10-CM | POA: Diagnosis not present

## 2020-01-30 DIAGNOSIS — L814 Other melanin hyperpigmentation: Secondary | ICD-10-CM | POA: Diagnosis not present

## 2020-01-30 DIAGNOSIS — D2272 Melanocytic nevi of left lower limb, including hip: Secondary | ICD-10-CM | POA: Diagnosis not present

## 2020-01-30 DIAGNOSIS — L853 Xerosis cutis: Secondary | ICD-10-CM | POA: Diagnosis not present

## 2020-03-20 DIAGNOSIS — M810 Age-related osteoporosis without current pathological fracture: Secondary | ICD-10-CM | POA: Diagnosis not present

## 2020-04-26 DIAGNOSIS — E785 Hyperlipidemia, unspecified: Secondary | ICD-10-CM | POA: Diagnosis not present

## 2020-04-26 DIAGNOSIS — M859 Disorder of bone density and structure, unspecified: Secondary | ICD-10-CM | POA: Diagnosis not present

## 2020-04-26 DIAGNOSIS — R82998 Other abnormal findings in urine: Secondary | ICD-10-CM | POA: Diagnosis not present

## 2020-05-03 DIAGNOSIS — Z1339 Encounter for screening examination for other mental health and behavioral disorders: Secondary | ICD-10-CM | POA: Diagnosis not present

## 2020-05-03 DIAGNOSIS — I10 Essential (primary) hypertension: Secondary | ICD-10-CM | POA: Diagnosis not present

## 2020-05-03 DIAGNOSIS — Z1331 Encounter for screening for depression: Secondary | ICD-10-CM | POA: Diagnosis not present

## 2020-05-03 DIAGNOSIS — Z0000 Encounter for general adult medical examination without abnormal findings: Secondary | ICD-10-CM | POA: Diagnosis not present

## 2020-05-20 DIAGNOSIS — M810 Age-related osteoporosis without current pathological fracture: Secondary | ICD-10-CM | POA: Diagnosis not present

## 2020-05-20 DIAGNOSIS — Z636 Dependent relative needing care at home: Secondary | ICD-10-CM | POA: Diagnosis not present

## 2020-06-11 ENCOUNTER — Encounter: Payer: Self-pay | Admitting: Orthopaedic Surgery

## 2020-06-11 ENCOUNTER — Ambulatory Visit: Payer: BC Managed Care – PPO | Admitting: Orthopaedic Surgery

## 2020-06-11 ENCOUNTER — Other Ambulatory Visit: Payer: Self-pay

## 2020-06-11 DIAGNOSIS — M79672 Pain in left foot: Secondary | ICD-10-CM

## 2020-06-11 NOTE — Progress Notes (Signed)
Office Visit Note   Patient: Kayla Guerra           Date of Birth: 23-May-1955           MRN: [id number] Visit Date: 06/11/2020              Requested by: Jarome Matin, MD [address] [place],  [place] [address] PCP: Jarome Matin, MD   Assessment & Plan: Visit Diagnoses:  1. Pain in left foot     Plan: We talked in general about her feet.  I want her to stop wearing the shoes that she is currently in because they offer no support at all.  I want her go back to [place].  Also wanted to stay off the treadmill and stretching her feet in the mornings and in the evenings.  I showed her how to do this.  I will hold off on any type of medications until after her dental procedure.  I would like to see her back in 2 months for repeat exam.  She does use the shop at [employer] for her shoes so I suggested she get back there so they can certainly look at her feet in terms of potential for inserts and more supportive firm shoes.  All questions concerns were answered and addressed.  See her back in 2 months  Follow-Up Instructions: Return in about 2 months (around 08/11/2020).   Orders:  No orders of the defined types were placed in this encounter.  No orders of the defined types were placed in this encounter.     Procedures: No procedures performed   Clinical Data: No additional findings.   Subjective: Chief Complaint  Patient presents with  . Left Ankle - Pain  The patient is a very pleasant 65 year old female that I am seeing for the first time.  She complains of numbness and tingling and pins-and-needles types of feeling in both of her feet around her heels.  However she has had this on the lateral aspect of her left foot and it does radiate up her leg some.  She is not diabetic.  She is a very petite individual.  She injured the peroneal tendons around her left ankle over a decade ago when she had to wear a boot.  She has exercise quite a bit recently due to osteopenia  and she was really hitting the treadmill hard.  At swing her feet started bothering her more so.  Also looked at her shoe wear.  She was wearing Shon Baton for a while but now she is wearing the cloud type of shoes that are light weight.  Those really do not offer much support.  She does have upcoming dental reconstructive surgery to be done at [facility] I think and so they are holding off on osteoporosis medications until after that procedure.  HPI  Review of Systems She currently denies any headache, chest pain, shortness of breath, fever, chills, nausea, vomiting  Objective: Vital Signs: LMP 07/24/2010   Physical Exam She is alert and orient x3 and in no acute distress Ortho Exam I examined her left foot and ankle.  Her exam is entirely normal.  She has a well-perfused foot with normal arch.  Her ankle is ligamentously stable as is her foot.  Her Achilles is intact.  Her knee exam the left side shows patellofemoral crepitation and her hip exam is normal.  There is no evidence of atrophy in the muscles of her foot.  There is no significant pain  over the plantar fascial but there is some pain around her heel but again is more of a neuropathic type of pain.  This seems to be the same on the right side. Specialty Comments:  No specialty comments available.  Imaging: No results found.   PMFS History: Patient Active Problem List   Diagnosis Date Noted  . Status post laparoscopic cholecystectomy Jan 2020 02/22/2018  . Menopause 07/17/2011   Past Medical History:  Diagnosis Date  . Anxiety   . Chronic constipation   . GERD (gastroesophageal reflux disease)   . Headache(784.0)   . Hypertension   . PONV (postoperative nausea and vomiting)   . Wears contact lenses     Family History  Problem Relation Age of Onset  . Heart disease Mother   . Hypertension Mother   . Thyroid disease Mother   . Breast cancer Mother 33  . Asthma Father   . Breast cancer Cousin 30  . Stroke Maternal Grandmother    . ADD / ADHD Son   . Other Son        mentally handicaped  . Heart disease Brother   . Colon cancer Cousin   . Colon cancer Cousin     Past Surgical History:  Procedure Laterality Date  . BREAST EXCISIONAL BIOPSY Right 06-26-1999    dr Luan Pulling  @[facility]   . CHOLECYSTECTOMY N/A 02/22/2018   Procedure: LAPAROSCOPIC CHOLECYSTECTOMY WITH INTRAOPERATIVE CHOLANGIOGRAM ERAS PATHWAY;  Surgeon: 04/23/2018, MD;  Location: [facility];  Service: General;  Laterality: N/A;  . D & C HYSTEROSCOPY WITH RESECTION POLYPS  10-10-2009    dr rivard  @[facility]   . DILATION AND CURETTAGE OF UTERUS  2006  . WISDOM TOOTH EXTRACTION     Social History   Occupational History  . Occupation: home  Tobacco Use  . Smoking status: Never Smoker  . Smokeless tobacco: Never Used  Vaping Use  . Vaping Use: Never used  Substance and Sexual Activity  . Alcohol use: Yes    Comment: occasional  . Drug use: Never  . Sexual activity: Yes    Birth control/protection: Surgical    Comment: vas

## 2020-06-12 DIAGNOSIS — H04123 Dry eye syndrome of bilateral lacrimal glands: Secondary | ICD-10-CM | POA: Diagnosis not present

## 2020-06-13 DIAGNOSIS — H0102B Squamous blepharitis left eye, upper and lower eyelids: Secondary | ICD-10-CM | POA: Diagnosis not present

## 2020-08-12 ENCOUNTER — Ambulatory Visit: Payer: BC Managed Care – PPO | Admitting: Orthopaedic Surgery

## 2020-09-04 DIAGNOSIS — D1801 Hemangioma of skin and subcutaneous tissue: Secondary | ICD-10-CM | POA: Diagnosis not present

## 2020-09-04 DIAGNOSIS — D225 Melanocytic nevi of trunk: Secondary | ICD-10-CM | POA: Diagnosis not present

## 2020-09-04 DIAGNOSIS — L814 Other melanin hyperpigmentation: Secondary | ICD-10-CM | POA: Diagnosis not present

## 2020-09-04 DIAGNOSIS — L853 Xerosis cutis: Secondary | ICD-10-CM | POA: Diagnosis not present

## 2020-10-31 DIAGNOSIS — R5383 Other fatigue: Secondary | ICD-10-CM | POA: Diagnosis not present

## 2020-10-31 DIAGNOSIS — R5382 Chronic fatigue, unspecified: Secondary | ICD-10-CM | POA: Diagnosis not present

## 2020-10-31 DIAGNOSIS — D649 Anemia, unspecified: Secondary | ICD-10-CM | POA: Diagnosis not present

## 2020-10-31 DIAGNOSIS — Z23 Encounter for immunization: Secondary | ICD-10-CM | POA: Diagnosis not present

## 2020-10-31 DIAGNOSIS — E559 Vitamin D deficiency, unspecified: Secondary | ICD-10-CM | POA: Diagnosis not present

## 2020-10-31 DIAGNOSIS — M79671 Pain in right foot: Secondary | ICD-10-CM | POA: Diagnosis not present

## 2020-11-05 ENCOUNTER — Other Ambulatory Visit: Payer: Self-pay | Admitting: Internal Medicine

## 2020-11-05 DIAGNOSIS — M79672 Pain in left foot: Secondary | ICD-10-CM

## 2020-11-07 ENCOUNTER — Other Ambulatory Visit: Payer: Self-pay

## 2020-11-07 ENCOUNTER — Ambulatory Visit (INDEPENDENT_AMBULATORY_CARE_PROVIDER_SITE_OTHER): Payer: BC Managed Care – PPO

## 2020-11-07 ENCOUNTER — Other Ambulatory Visit: Payer: Self-pay | Admitting: Podiatry

## 2020-11-07 ENCOUNTER — Ambulatory Visit: Payer: BC Managed Care – PPO | Admitting: Podiatry

## 2020-11-07 DIAGNOSIS — M722 Plantar fascial fibromatosis: Secondary | ICD-10-CM

## 2020-11-07 DIAGNOSIS — M79671 Pain in right foot: Secondary | ICD-10-CM

## 2020-11-07 MED ORDER — MELOXICAM 15 MG PO TABS: 15.0000 mg | ORAL_TABLET | Freq: Every day | ORAL | 0 refills | Status: DC

## 2020-11-07 NOTE — Patient Instructions (Signed)
For instructions on how to put on your Plantar Fascial Brace, please visit [email or web address]   Plantar Fasciitis (Heel Spur Syndrome) with Rehab The plantar fascia is a fibrous, ligament-like, soft-tissue structure that spans the bottom of the foot. Plantar fasciitis is a condition that causes pain in the foot due to inflammation of the tissue. SYMPTOMS   Pain and tenderness on the underneath side of the foot.  Pain that worsens with standing or walking. CAUSES  Plantar fasciitis is caused by irritation and injury to the plantar fascia on the underneath side of the foot. Common mechanisms of injury include:  Direct trauma to bottom of the foot.  Damage to a small nerve that runs under the foot where the main fascia attaches to the heel bone.  Stress placed on the plantar fascia due to bone spurs. RISK INCREASES WITH:   Activities that place stress on the plantar fascia (running, jumping, pivoting, or cutting).  Poor strength and flexibility.  Improperly fitted shoes.  Tight calf muscles.  Flat feet.  Failure to warm-up properly before activity.  Obesity. PREVENTION  Warm up and stretch properly before activity.  Allow for adequate recovery between workouts.  Maintain physical fitness:  Strength, flexibility, and endurance.  Cardiovascular fitness.  Maintain a health body weight.  Avoid stress on the plantar fascia.  Wear properly fitted shoes, including arch supports for individuals who have flat feet.  PROGNOSIS  If treated properly, then the symptoms of plantar fasciitis usually resolve without surgery. However, occasionally surgery is necessary.  RELATED COMPLICATIONS   Recurrent symptoms that may result in a chronic condition.  Problems of the lower back that are caused by compensating for the injury, such as limping.  Pain or weakness of the foot during push-off following surgery.  Chronic inflammation, scarring, and partial or complete  fascia tear, occurring more often from repeated injections.  TREATMENT  Treatment initially involves the use of ice and medication to help reduce pain and inflammation. The use of strengthening and stretching exercises may help reduce pain with activity, especially stretches of the Achilles tendon. These exercises may be performed at home or with a therapist. Your caregiver may recommend that you use heel cups of arch supports to help reduce stress on the plantar fascia. Occasionally, corticosteroid injections are given to reduce inflammation. If symptoms persist for greater than 6 months despite non-surgical (conservative), then surgery may be recommended.   MEDICATION   If pain medication is necessary, then nonsteroidal anti-inflammatory medications, such as aspirin and ibuprofen, or other minor pain relievers, such as acetaminophen, are often recommended.  Do not take pain medication within 7 days before surgery.  Prescription pain relievers may be given if deemed necessary by your caregiver. Use only as directed and only as much as you need.  Corticosteroid injections may be given by your caregiver. These injections should be reserved for the most serious cases, because they may only be given a certain number of times.  HEAT AND COLD  Cold treatment (icing) relieves pain and reduces inflammation. Cold treatment should be applied for 10 to 15 minutes every 2 to 3 hours for inflammation and pain and immediately after any activity that aggravates your symptoms. Use ice packs or massage the area with a piece of ice (ice massage).  Heat treatment may be used prior to performing the stretching and strengthening activities prescribed by your caregiver, physical therapist, or athletic trainer. Use a heat pack or soak the injury in warm water.  SEEK IMMEDIATE MEDICAL   CARE IF:  Treatment seems to offer no benefit, or the condition worsens.  Any medications produce adverse side effects.   EXERCISES- RANGE OF MOTION (ROM) AND STRETCHING EXERCISES - Plantar Fasciitis (Heel Spur Syndrome) These exercises may help you when beginning to rehabilitate your injury. Your symptoms may resolve with or without further involvement from your physician, physical therapist or athletic trainer. While completing these exercises, remember:   Restoring tissue flexibility helps normal motion to return to the joints. This allows healthier, less painful movement and activity.  An effective stretch should be held for at least 30 seconds.  A stretch should never be painful. You should only feel a gentle lengthening or release in the stretched tissue.  RANGE OF MOTION - Toe Extension, Flexion  Sit with your right / left leg crossed over your opposite knee.  Grasp your toes and gently pull them back toward the top of your foot. You should feel a stretch on the bottom of your toes and/or foot.  Hold this stretch for 10 seconds.  Now, gently pull your toes toward the bottom of your foot. You should feel a stretch on the top of your toes and or foot.  Hold this stretch for 10 seconds. Repeat  times. Complete this stretch 3 times per day.   RANGE OF MOTION - Ankle Dorsiflexion, Active Assisted  Remove shoes and sit on a chair that is preferably not on a carpeted surface.  Place right / left foot under knee. Extend your opposite leg for support.  Keeping your heel down, slide your right / left foot back toward the chair until you feel a stretch at your ankle or calf. If you do not feel a stretch, slide your bottom forward to the edge of the chair, while still keeping your heel down.  Hold this stretch for 10 seconds. Repeat 3 times. Complete this stretch 2 times per day.   STRETCH  Gastroc, Standing  Place hands on wall.  Extend right / left leg, keeping the front knee somewhat bent.  Slightly point your toes inward on your back foot.  Keeping your right / left heel on the floor and your  knee straight, shift your weight toward the wall, not allowing your back to arch.  You should feel a gentle stretch in the right / left calf. Hold this position for 10 seconds. Repeat 3 times. Complete this stretch 2 times per day.  STRETCH  Soleus, Standing  Place hands on wall.  Extend right / left leg, keeping the other knee somewhat bent.  Slightly point your toes inward on your back foot.  Keep your right / left heel on the floor, bend your back knee, and slightly shift your weight over the back leg so that you feel a gentle stretch deep in your back calf.  Hold this position for 10 seconds. Repeat 3 times. Complete this stretch 2 times per day.  STRETCH  Gastrocsoleus, Standing  Note: This exercise can place a lot of stress on your foot and ankle. Please complete this exercise only if specifically instructed by your caregiver.   Place the ball of your right / left foot on a step, keeping your other foot firmly on the same step.  Hold on to the wall or a rail for balance.  Slowly lift your other foot, allowing your body weight to press your heel down over the edge of the step.  You should feel a stretch in your right / left calf.  Hold this   physician, physical therapist or athletic trainer. While completing these exercises, remember:  Muscles can gain both the endurance and the strength needed for everyday activities through controlled exercises. Complete these exercises as instructed by your physician, physical therapist or athletic trainer. Progress the resistance and repetitions only as guided.  STRENGTH - Towel Curls Sit in a chair positioned on a non-carpeted surface. Place your foot on a towel, keeping your heel on the  floor. Pull the towel toward your heel by only curling your toes. Keep your heel on the floor. Repeat 3 times. Complete this exercise 2 times per day.  STRENGTH - Ankle Inversion Secure one end of a rubber exercise band/tubing to a fixed object (table, pole). Loop the other end around your foot just before your toes. Place your fists between your knees. This will focus your strengthening at your ankle. Slowly, pull your big toe up and in, making sure the band/tubing is positioned to resist the entire motion. Hold this position for 10 seconds. Have your muscles resist the band/tubing as it slowly pulls your foot back to the starting position. Repeat 3 times. Complete this exercises 2 times per day.  Document Released: 02/02/2005 Document Revised: 04/27/2011 Document Reviewed: 05/17/2008 [employer] Patient Information 2014 [employer]  Meloxicam Tablets What is this medication? MELOXICAM (mel OX i cam) treats mild to moderate pain, inflammation, or arthritis. It works by decreasing inflammation. It belongs to a group of medications called NSAIDs. This medicine may be used for other purposes; ask your health care provider or pharmacist if you have questions. COMMON BRAND NAME(S): Mobic What should I tell my care team before I take this medication? They need to know if you have any of these conditions: Asthma (lung or breathing disease) Bleeding disorder Coronary artery bypass graft (CABG) within the past 2 weeks Dehydration Heart attack Heart disease Heart failure High blood pressure If you often drink alcohol Kidney disease Liver disease Smoke tobacco cigarettes Stomach bleeding Stomach ulcers, other stomach or intestine problems Take medications that treat or prevent blood clots Taking other steroids like dexamethasone or prednisone An unusual or allergic reaction to meloxicam, other medications, foods, dyes, or preservatives Pregnant or trying to get  pregnant Breast-feeding How should I use this medication? Take this medication by mouth. Take it as directed on the prescription label at the same time every day. You can take it with or without food. If it upsets your stomach, take it with food. Do not use it more often than directed. There may be unused or extra doses in the bottle after you finish your treatment. Talk to your care team if you have questions about your dose. A special MedGuide will be given to you by the pharmacist with each prescription and refill. Be sure to read this information carefully each time. Talk to your care team about the use of this medication in children. Special care may be needed. Patients over 65 years of age may have a stronger reaction and need a smaller dose. Overdosage: If you think you have taken too much of this medicine contact a poison control center or emergency room at once. NOTE: This medicine is only for you. Do not share this medicine with others. What if I miss a dose? If you miss a dose, take it as soon as you can. If it is almost time for your next dose, take only that dose. Do not take double or extra doses. What may interact with this medication? Do not take this medication   with any of the following: Cidofovir Ketorolac This medication may also interact with the following: Aspirin and aspirin-like medications Certain medications for blood pressure, heart disease, irregular heart beat Certain medications for depression, anxiety, or psychotic disturbances Certain medications that treat or prevent blood clots like warfarin, enoxaparin, dalteparin, apixaban, dabigatran, rivaroxaban Cyclosporine Diuretics Fluconazole Lithium Methotrexate Other NSAIDs, medications for pain and inflammation, like ibuprofen and naproxen Pemetrexed This list may not describe all possible interactions. Give your health care provider a list of all the medicines, herbs, non-prescription drugs, or dietary  supplements you use. Also tell them if you smoke, drink alcohol, or use illegal drugs. Some items may interact with your medicine. What should I watch for while using this medication? Visit your care team for regular checks on your progress. Tell your care team if your symptoms do not start to get better or if they get worse. Do not take other medications that contain aspirin, ibuprofen, or naproxen with this medication. Side effects such as stomach upset, nausea, or ulcers may be more likely to occur. Many non-prescription medications contain aspirin, ibuprofen, or naproxen. Always read labels carefully. This medication can cause serious ulcers and bleeding in the stomach. It can happen with no warning. Smoking, drinking alcohol, older age, and poor health can also increase risks. Call your care team right away if you have stomach pain or blood in your vomit or stool. This medication does not prevent a heart attack or stroke. This medication may increase the chance of a heart attack or stroke. The chance may increase the longer you use this medication or if you have heart disease. If you take aspirin to prevent a heart attack or stroke, talk to your care team about using this medication. Alcohol may interfere with the effect of this medication. Avoid alcoholic drinks. This medication may cause serious skin reactions. They can happen weeks to months after starting the medication. Contact your care team right away if you notice fevers or flu-like symptoms with a rash. The rash may be red or purple and then turn into blisters or peeling of the skin. Or, you might notice a red rash with swelling of the face, lips or lymph nodes in your neck or under your arms. Talk to your care team if you are pregnant before taking this medication. Taking this medication between weeks 20 and 30 of pregnancy may harm your unborn baby. Your care team will monitor you closely if you need to take it. After 30 weeks of pregnancy,  do not take this medication. You may get drowsy or dizzy. Do not drive, use machinery, or do anything that needs mental alertness until you know how this medication affects you. Do not stand up or sit up quickly, especially if you are an older patient. This reduces the risk of dizzy or fainting spells. Be careful brushing or flossing your teeth or using a toothpick because you may get an infection or bleed more easily. If you have any dental work done, tell your dentist you are receiving this medication. This medication may make it more difficult to get pregnant. Talk to your care team if you are concerned about your fertility. What side effects may I notice from receiving this medication? Side effects that you should report to your care team as soon as possible: Allergic reactions-skin rash, itching, hives, swelling of the face, lips, tongue, or throat Bleeding-bloody or black, tar-like stools, vomiting blood or brown material that looks like coffee grounds, red or dark brown   urine, small red or purple spots on skin, unusual bruising or bleeding Heart attack-pain or tightness in the chest, shoulders, arms, or jaw, nausea, shortness of breath, cold or clammy skin, feeling faint or lightheaded Heart failure-shortness of breath, swelling of ankles, feet, or hands, sudden weight gain, unusual weakness or fatigue Increase in blood pressure Kidney injury-decrease in the amount of urine, swelling of the ankles, hands, or feet Liver injury-right upper belly pain, loss of appetite, nausea, light-colored stool, dark yellow or brown urine, yellowing skin or eyes, unusual weakness or fatigue Rash, fever, and swollen lymph nodes Redness, blistering, peeling, or loosening of the skin, including inside the mouth Stroke-sudden numbness or weakness of the face, arm, or leg, trouble speaking, confusion, trouble walking, loss of balance or coordination, dizziness, severe headache, change in vision Side effects that  usually do not require medical attention (report to your care team if they continue or are bothersome): Diarrhea Nausea Upset stomach This list may not describe all possible side effects. Call your doctor for medical advice about side effects. You may report side effects to FDA at 1-800-FDA-1088. Where should I keep my medication? Keep out of the reach of children and pets. Store at room temperature between 20 and 25 degrees C (68 and 77 degrees F). Protect from moisture. Keep the container tightly closed. Get rid of any unused medication after the expiration date. To get rid of medications that are no longer needed or have expired: Take the medication to a medication take-back program. Check with your pharmacy or law enforcement to find a location. If you cannot return the medication, check the label or package insert to see if the medication should be thrown out in the garbage or flushed down the toilet. If you are not sure, ask your care team. If it is safe to put it in the trash, empty the medication out of the container. Mix the medication with cat litter, dirt, coffee grounds, or other unwanted substance. Seal the mixture in a bag or container. Put it in the trash. NOTE: This sheet is a summary. It may not cover all possible information. If you have questions about this medicine, talk to your doctor, pharmacist, or health care provider.  2022 Elsevier/Gold Standard (2020-03-28 11:53:22)  

## 2020-11-11 ENCOUNTER — Ambulatory Visit: Payer: BC Managed Care – PPO | Admitting: Orthopaedic Surgery

## 2020-11-12 NOTE — Progress Notes (Addendum)
Subjective:   Patient ID: Kayla Guerra, female   DOB: 65 y.o.   MRN: [id number]   HPI 65 year old female presents the office today for discomfort that starts in the heel and she feels like sharp pain, electrical shock in her heel that is to go into the back of her leg at times.  Discussed moving into the back of her Achilles.  She likes to do speed walking about 2 miles a day.  She has tried changing shoes.  She says it hurts if she walks or stands for long time.  This been ongoing for about 5 months.  She states she is always on her feet.  No radiating pain otherwise.  No recent treatment.   Review of Systems  All other systems reviewed and are negative.  Past Medical History:  Diagnosis Date   Anxiety    Chronic constipation    GERD (gastroesophageal reflux disease)    Headache(784.0)    Hypertension    PONV (postoperative nausea and vomiting)    Wears contact lenses     Past Surgical History:  Procedure Laterality Date   BREAST EXCISIONAL BIOPSY Right 06-26-1999    dr Luan Pulling  @[facility]    CHOLECYSTECTOMY N/A 02/22/2018   Procedure: LAPAROSCOPIC CHOLECYSTECTOMY WITH INTRAOPERATIVE CHOLANGIOGRAM ERAS PATHWAY;  Surgeon: 04/23/2018, MD;  Location: [facility];  Service: General;  Laterality: N/A;   D & C HYSTEROSCOPY WITH RESECTION POLYPS  10-10-2009    dr rivard  @[facility]    DILATION AND CURETTAGE OF UTERUS  2006   WISDOM TOOTH EXTRACTION       Current Outpatient Medications:    meloxicam (MOBIC) 15 MG tablet, Take 1 tablet (15 mg total) by mouth daily., Disp: 30 tablet, Rfl: 0   ALPRAZolam (XANAX) 0.5 MG tablet, Take 0.5 mg by mouth 2 (two) times daily as needed for anxiety or sleep. , Disp: , Rfl:    HYDROcodone-acetaminophen (NORCO/VICODIN) 5-325 MG tablet, Take 1 tablet by mouth every 6 (six) hours as needed for moderate pain., Disp: 20 tablet, Rfl: 0   ibuprofen (ADVIL,MOTRIN) 600 MG tablet, Take 1 tablet (600 mg total) by mouth every 6 (six) hours as needed. (Patient not  taking: Reported on 02/11/2018), Disp: 30 tablet, Rfl: 0   metoCLOPramide (REGLAN) 10 MG tablet, Take 1 tablet (10 mg total) by mouth every 6 (six) hours as needed for nausea (headache). (Patient not taking: Reported on 02/11/2018), Disp: 30 tablet, Rfl: 0   Metoprolol Succinate 100 MG CS24, Take 50 mg by mouth 2 (two) times daily., Disp: , Rfl:    omeprazole (PRILOSEC) 40 MG capsule, Take 40 mg by mouth daily., Disp: , Rfl:   Allergies  Allergen Reactions   Tetracyclines & Related Other (See Comments)    Throat inflammed   Penicillins     As child DID THE REACTION INVOLVE: Swelling of the face/tongue/throat, SOB, or low BP? Unknown Sudden or severe rash/hives, skin peeling, or the inside of the mouth or nose? Unknown Did it require medical treatment? Unknown When did it last happen?   childhood    If all above answers are "NO", may proceed with cephalosporin use.    Erythromycin Other (See Comments)    As child - unknown          Objective:  Physical Exam  General: AAO x3, NAD  Dermatological: Skin is warm, dry and supple bilateral.  There are no open sores, no preulcerative lesions, no rash or signs of infection present.  Vascular: Dorsalis Pedis  artery and Posterior Tibial artery pedal pulses are 2/4 bilateral with immedate capillary fill time. There is no pain with calf compression, swelling, warmth, erythema.   Neruologic: Grossly intact via light touch bilateral. Negative tinel sign.  Musculoskeletal: There is tenderness palpation on the plantar medial tubercle of the calcaneus and insertion of plantar fascia noted to the arch of the foot.  She does get some pain going up the Achilles tendon but clinically the tendon appears to be intact.  No edema, erythema noted to this area.  Muscular strength 5/5 in all groups tested bilateral.  Equinus present.  Gait: Unassisted, Nonantalgic.       Assessment:   Plantar fasciitis, tendinitis     Plan:  -Treatment options  discussed including all alternatives, risks, and complications -Etiology of symptoms were discussed -X-rays were obtained and reviewed with the patient.  No evidence of acute fracture or stress fracture.  No calcaneal spurring is evident. -Plantar fascial brace dispensed -Night splint -Mobic -Stretching/icing daily.  -Discussed shoe modifications and orthotics. -Symptoms continue consider MRI. Vivi Barrack DPM

## 2020-11-27 DIAGNOSIS — Z87440 Personal history of urinary (tract) infections: Secondary | ICD-10-CM | POA: Diagnosis not present

## 2020-11-27 DIAGNOSIS — N952 Postmenopausal atrophic vaginitis: Secondary | ICD-10-CM | POA: Diagnosis not present

## 2020-12-04 ENCOUNTER — Other Ambulatory Visit: Payer: Self-pay | Admitting: Podiatry

## 2020-12-13 ENCOUNTER — Ambulatory Visit: Payer: BC Managed Care – PPO | Admitting: Podiatry

## 2021-03-11 IMAGING — MR MR BREAST BILAT WO/W CM
8 of 12 series · 33 of 48 positions shown · IV contrast (5 ML GADAVIST)
Comparison: 11/07/2018 and MRI on 10/24/2015 and 10/01/2009

CLINICAL DATA: Family history of breast cancer, diagnosed in
patient's mother at age 50.

LABS:  None obtained at the time of imaging.
EXAM:
BILATERAL BREAST MRI WITH AND WITHOUT CONTRAST
TECHNIQUE: Multiplanar, multisequence MR images of both breasts were obtained
prior to and following the intravenous administration of 5 ml of
Gadavist

[Series 2: t2_tirm_tra ipat (a-p) · axial · 3.0mm · 0.66mm/px · 1 of 55 slices shown]
[im 1/55]
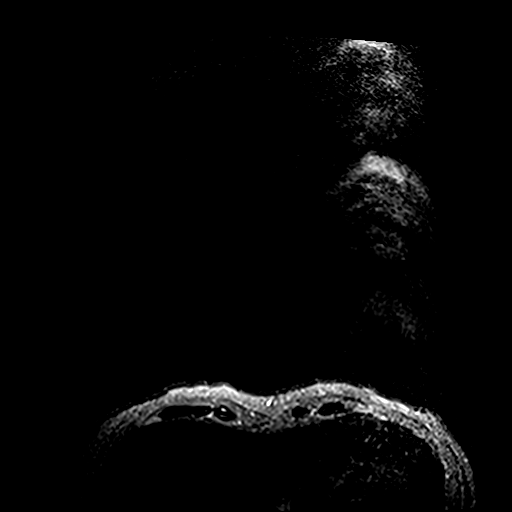

[Series 3: fl3d pre-cm no · axial · non-contrast · 1.2mm · 0.89mm/px · z∈[-104,+68]mm · 5 of 144 slices shown]
[im 1/144]
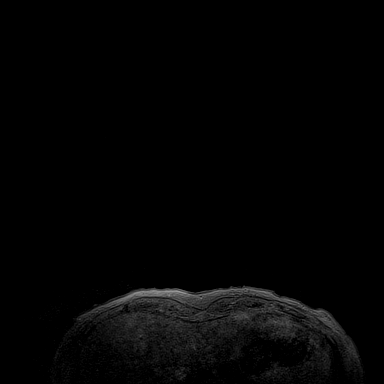
[im 36/144]
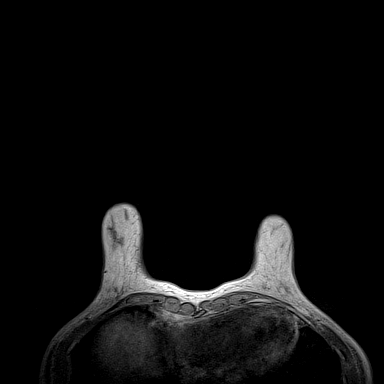
[im 72/144]
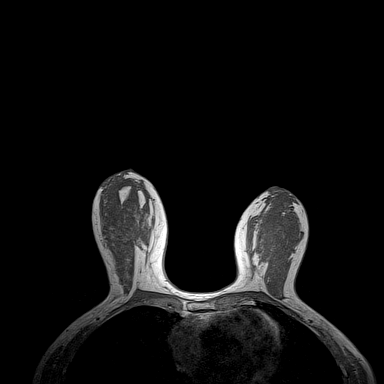
[im 108/144]
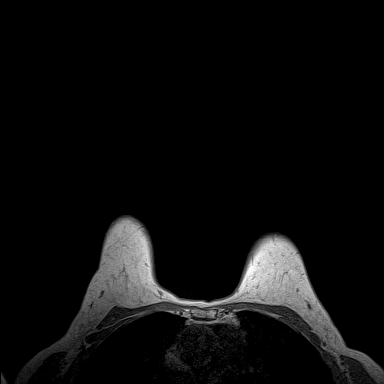
[im 144/144]
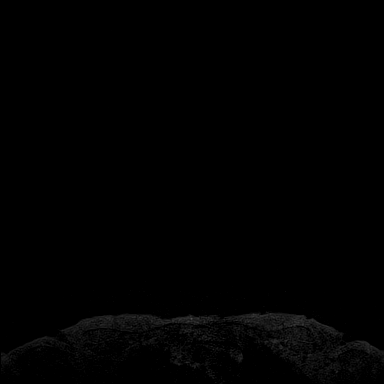

[Series 4: fl3d pre-cm · axial · non-contrast · 1.2mm · 0.89mm/px · z∈[-104,+68]mm · 5 of 144 slices shown]
[im 1/144]
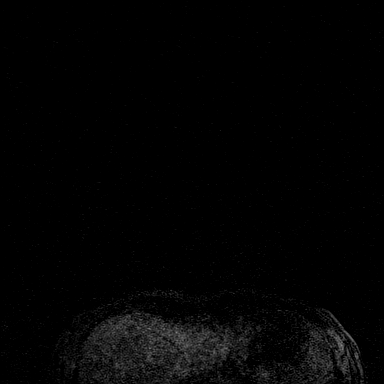
[im 36/144]
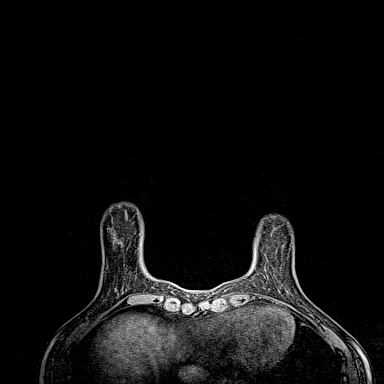
[im 72/144]
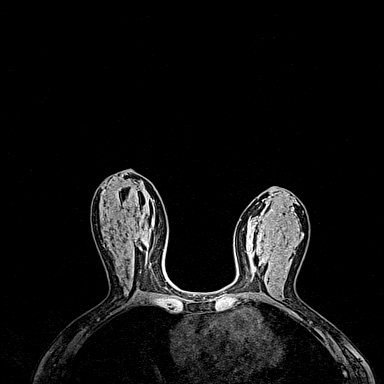
[im 108/144]
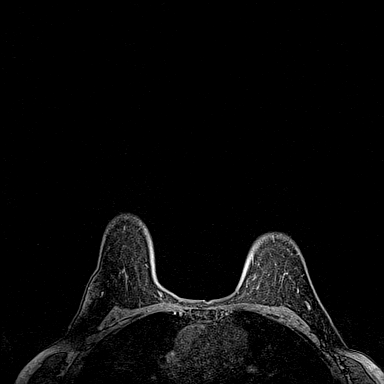
[im 144/144]
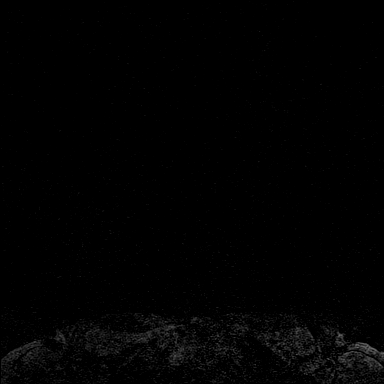

[Series 5: fl3d post-cm 20 · axial · 1.2mm · 0.89mm/px · z∈[-104,+68]mm · 5 of 144 slices shown (1 of 3)]
[im 1/144]
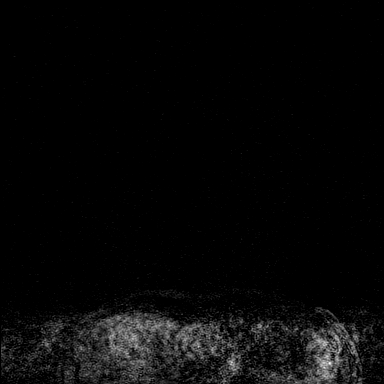
[im 36/144]
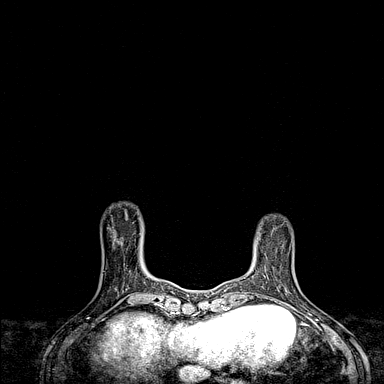
[im 72/144]
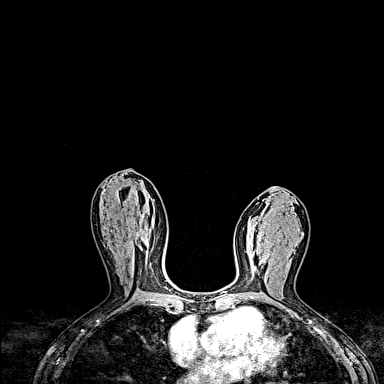
[im 108/144]
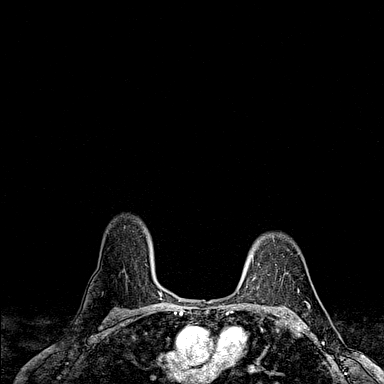
[im 144/144]
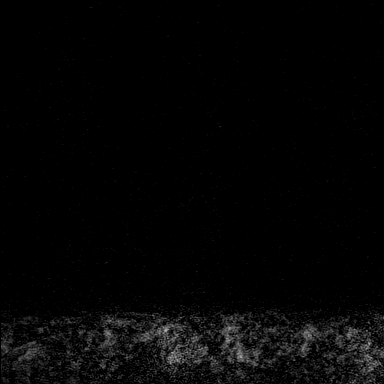

[Series 6: fl3d post-cm 20 · axial · 1.2mm · 0.89mm/px · z∈[-104,+68]mm · 5 of 144 slices shown (2 of 3)]
[im 1/144]
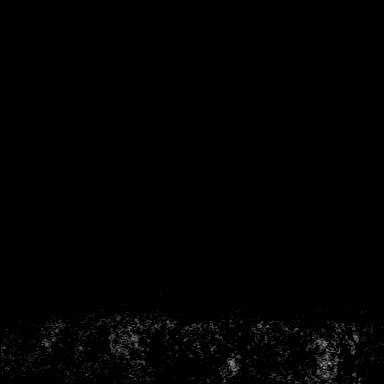
[im 36/144]
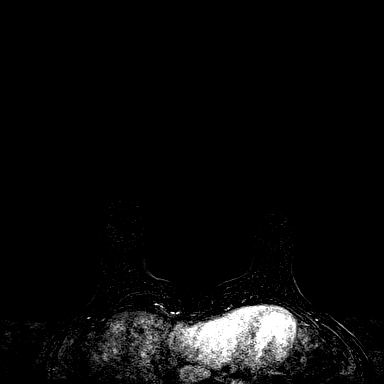
[im 72/144]
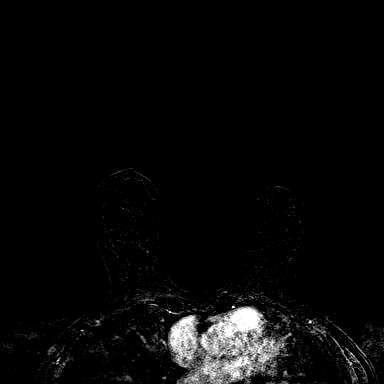
[im 108/144]
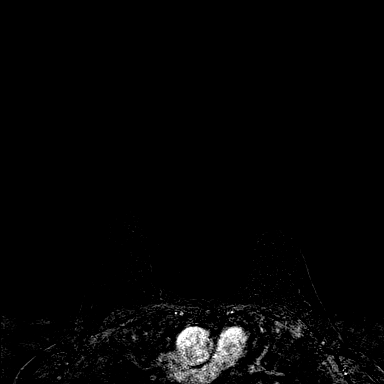
[im 144/144]
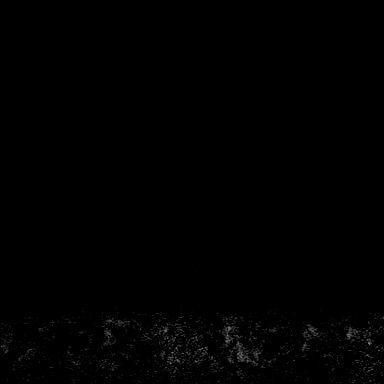

[Series 7: fl3d post-cm 20 · axial · 172.8mm · 0.89mm/px · 1 of 1 slices shown (3 of 3)]
[im 1/1]
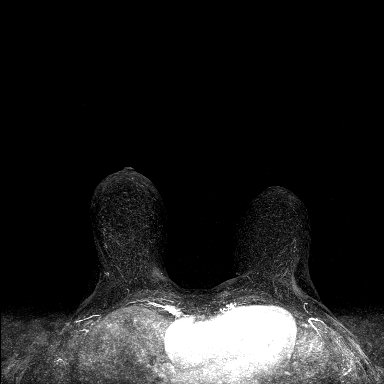

[Series 8: fl3d post-cm 3min · axial · 1.2mm · 0.89mm/px · z∈[-104,+68]mm · 6 of 144 slices shown]
[im 1/144]
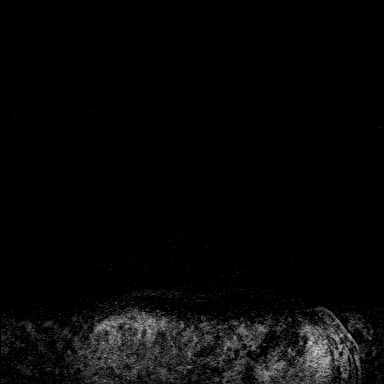
[im 29/144]
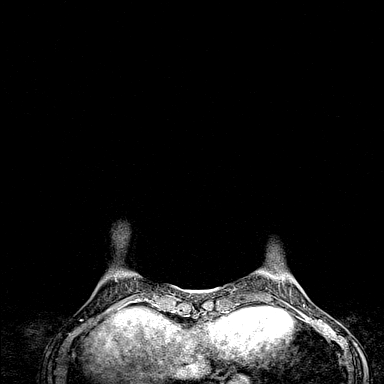
[im 58/144]
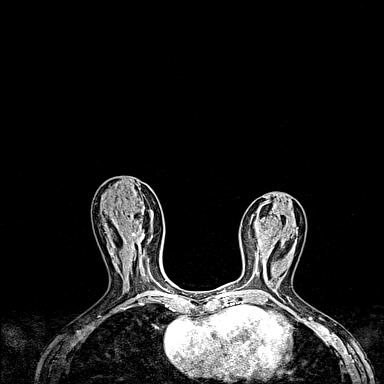
[im 86/144]
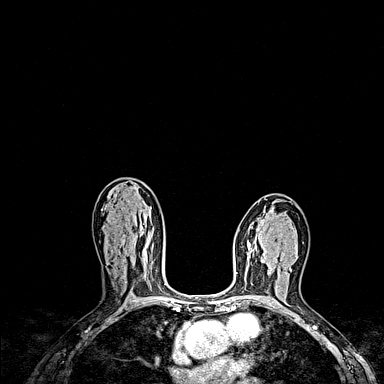
[im 115/144]
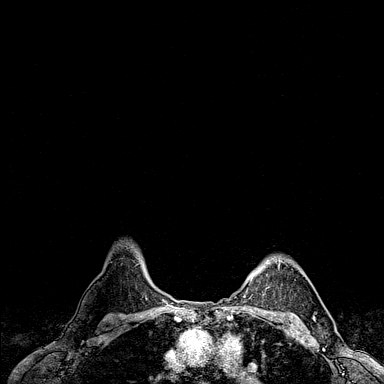
[im 144/144]
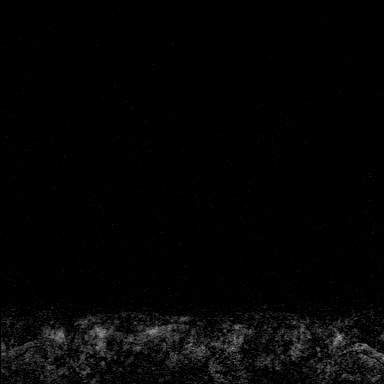

[Series 9: fl3d post-cm 3min_sub · axial · 1.2mm · 0.89mm/px · z∈[-104,+33]mm · 5 of 144 slices shown]
[im 1/144]
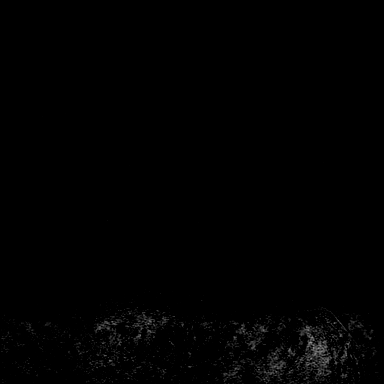
[im 29/144]
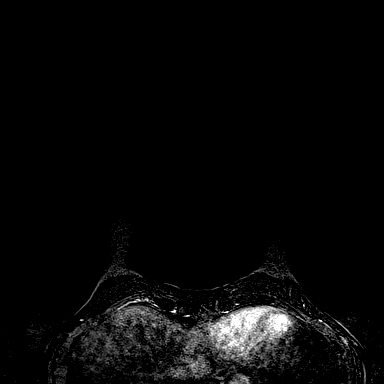
[im 58/144]
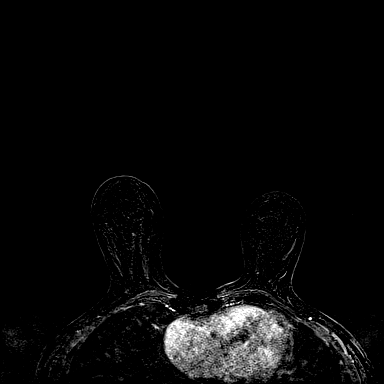
[im 86/144]
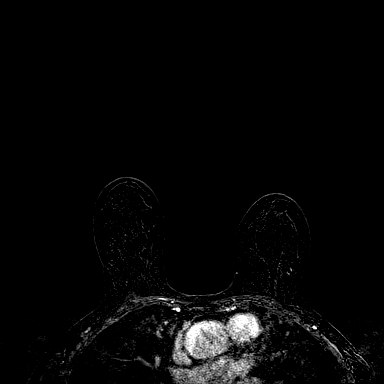
[im 115/144]
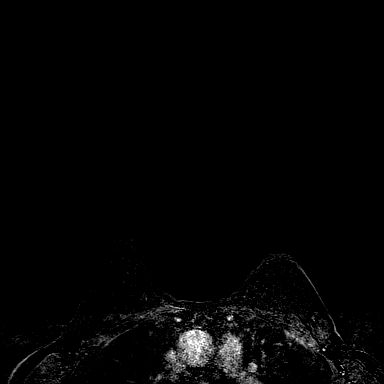

[33 of 48 positions shown; findings below may reference images not displayed]

Three-dimensional MR images were rendered by post-processing of the
original MR data on an independent workstation. The
three-dimensional MR images were interpreted, and findings are
reported in the following complete MRI report for this study. Three
dimensional images were evaluated at the independent interpreting
workstation using the DynaCAD thin client.
FINDINGS: Breast composition: d. Extreme fibroglandular tissue.

Background parenchymal enhancement: Minimal

Right breast: No mass or abnormal enhancement. Stable benign
intramammary lymph node is identified in UPPER OUTER QUADRANT of the
RIGHT breast.

Left breast: No mass or abnormal enhancement.

Lymph nodes: No abnormal appearing lymph nodes.

Ancillary findings:  None.
IMPRESSION: No MRI evidence for malignancy in either breast.

RECOMMENDATION:
Recommend screening mammogram at this time.

Based on the recommendations of the [employer], annual
screening MRI is suggested in addition to annual mammography if the
patient has an estimated lifetime risk of developing breast cancer
which is greater than 20%.

BI-RADS CATEGORY  1: Negative.

## 2021-05-01 DIAGNOSIS — D2271 Melanocytic nevi of right lower limb, including hip: Secondary | ICD-10-CM | POA: Diagnosis not present

## 2021-05-01 DIAGNOSIS — D225 Melanocytic nevi of trunk: Secondary | ICD-10-CM | POA: Diagnosis not present

## 2021-05-01 DIAGNOSIS — L814 Other melanin hyperpigmentation: Secondary | ICD-10-CM | POA: Diagnosis not present

## 2021-05-05 ENCOUNTER — Other Ambulatory Visit: Payer: Self-pay

## 2021-05-05 ENCOUNTER — Ambulatory Visit (HOSPITAL_BASED_OUTPATIENT_CLINIC_OR_DEPARTMENT_OTHER): Payer: Medicare Other | Admitting: Obstetrics & Gynecology

## 2021-05-05 ENCOUNTER — Encounter (HOSPITAL_BASED_OUTPATIENT_CLINIC_OR_DEPARTMENT_OTHER): Payer: Self-pay | Admitting: Obstetrics & Gynecology

## 2021-05-05 ENCOUNTER — Other Ambulatory Visit (HOSPITAL_COMMUNITY)
Admission: RE | Admit: 2021-05-05 | Discharge: 2021-05-05 | Disposition: A | Discharge status: Home / Self Care | Payer: Medicare Other | Source: Ambulatory Visit | Attending: Obstetrics & Gynecology | Admitting: Obstetrics & Gynecology

## 2021-05-05 VITALS — BP 116/82 | HR 61 | Ht 62.5 in | Wt 101.0 lb

## 2021-05-05 DIAGNOSIS — Z1231 Encounter for screening mammogram for malignant neoplasm of breast: Secondary | ICD-10-CM

## 2021-05-05 DIAGNOSIS — Z803 Family history of malignant neoplasm of breast: Secondary | ICD-10-CM | POA: Diagnosis not present

## 2021-05-05 DIAGNOSIS — N952 Postmenopausal atrophic vaginitis: Secondary | ICD-10-CM | POA: Diagnosis not present

## 2021-05-05 DIAGNOSIS — Z124 Encounter for screening for malignant neoplasm of cervix: Secondary | ICD-10-CM | POA: Diagnosis not present

## 2021-05-05 DIAGNOSIS — N889 Noninflammatory disorder of cervix uteri, unspecified: Secondary | ICD-10-CM

## 2021-05-05 DIAGNOSIS — M810 Age-related osteoporosis without current pathological fracture: Secondary | ICD-10-CM

## 2021-05-05 DIAGNOSIS — R8290 Unspecified abnormal findings in urine: Secondary | ICD-10-CM | POA: Diagnosis not present

## 2021-05-05 LAB — POCT URINALYSIS DIPSTICK
Bilirubin, UA: NEGATIVE
Blood, UA: NEGATIVE
Glucose, UA: NEGATIVE
Ketones, UA: NEGATIVE
Nitrite, UA: NEGATIVE
Protein, UA: NEGATIVE
Spec Grav, UA: <=1.005 — AB (ref 1.010–1.025)
Urobilinogen, UA: 0.2 E.U./dL
pH, UA: 6 (ref 5.0–8.0)

## 2021-05-05 MED ORDER — NONFORMULARY OR COMPOUNDED ITEM: 3 refills | Status: DC

## 2021-05-05 NOTE — Progress Notes (Addendum)
66 y.o. G52P0101 Married White or Caucasian female here for new patient exam.  Denies vaginal bleeding.  She is not on HRT.  Prior patient of Dr. Cletis Media.  Last pap smear 2020.  Tyrer Cusick risk model done today with 11% lifetime risk.  Needs updated MMG.  Grade D breast density.  Has osteoporosis.  She's had significant dental work due to resorption of bone in her jaw.  She did see a specialist at [facility] 05/20/2020.  She's not started any medication.   ? ?Patient's last menstrual period was 07/24/2010.          ?Sexually active: Yes.    ?The current method of family planning is post menopausal status.    ?Exercising: Yes.     ?Smoker:  no ? ?Health Maintenance: ?Pap:  2020 neg with neg HR HPV (will be scanned into Epic and in Dr. Boyd Kerbs outside records) ?History of abnormal Pap:  no ?MMG:  2021 ?Colonoscopy:  2018, follow up 10 years.  Dr Henrene Pastor ?BMD:   done with Dr. Sharlett Iles ?Screening Labs: Dr. Sharlett Iles ? ? reports that she has never smoked. She has never used smokeless tobacco. She reports that she does not currently use alcohol. She reports that she does not use drugs. ? ?Past Medical History:  ?Diagnosis Date  ? Anxiety   ? BRCA gene mutation negative   ? Chronic constipation   ? GERD (gastroesophageal reflux disease)   ? Headache(784.0)   ? Hypertension   ? PONV (postoperative nausea and vomiting)   ? Wears contact lenses   ? ? ?Past Surgical History:  ?Procedure Laterality Date  ? BREAST EXCISIONAL BIOPSY Right 06-26-1999    dr Truitt Leep  '@[facility]'   ? CHOLECYSTECTOMY N/A 02/22/2018  ? Procedure: LAPAROSCOPIC CHOLECYSTECTOMY WITH INTRAOPERATIVE CHOLANGIOGRAM ERAS PATHWAY;  Surgeon: Johnathan Hausen, MD;  Location: [facility];  Service: General;  Laterality: N/A;  ? D & C HYSTEROSCOPY WITH RESECTION POLYPS  10-10-2009    dr rivard  '@[facility]'   ? [place] OF UTERUS  2006  ? WISDOM TOOTH EXTRACTION    ? ? ?Current Outpatient Medications  ?Medication Sig Dispense Refill  ? ALPRAZolam (XANAX) 0.5 MG tablet Take 0.5 mg  by mouth 2 (two) times daily as needed for anxiety or sleep.     ? Calcium-Phosphorus-Vitamin D (CITRACAL +D3 PO) Take by mouth.    ? Metoprolol Succinate 100 MG CS24 Take 50 mg by mouth 2 (two) times daily.    ? NONFORMULARY OR COMPOUNDED ITEM Estradiol/vitamin e 0.02%/200IU/GM    ? Omega-3 Fatty Acids (OMEGA-3 2100 PO) Take by mouth.    ? Vitamin D, Ergocalciferol, (DRISDOL) 1.25 MG (50000 UNIT) CAPS capsule Take 50,000 Units by mouth every 7 (seven) days.    ? ?No current facility-administered medications for this visit.  ? ? ?Family History  ?Problem Relation Age of Onset  ? Stroke Maternal Grandmother   ? Asthma Father   ? Heart disease Mother   ? Hypertension Mother   ? Thyroid disease Mother   ? Breast cancer Mother 42  ? Heart disease Brother   ? ADD / ADHD Son   ? Other Son   ?     mentally handicaped  ? Breast cancer Cousin 30  ? Colon cancer Cousin   ? Colon cancer Cousin   ? ? ?Review of Systems  ?All other systems reviewed and are negative. ? ?Exam:   ?BP 116/82   Pulse 61   Ht 5' 2.5" (1.588 m)  Wt 101 lb (45.8 kg)   LMP 07/24/2010   SpO2 100%   BMI 18.18 kg/m?   Height: 5' 2.5" (158.8 cm) ? ?General appearance: alert, cooperative and appears stated age ?Head: Normocephalic, without obvious abnormality, atraumatic ?Neck: no adenopathy, supple, symmetrical, trachea midline and thyroid normal to inspection and palpation ?Lungs: clear to auscultation bilaterally ?Breasts: normal appearance, no masses or tenderness ?Heart: regular rate and rhythm ?Abdomen: soft, non-tender; bowel sounds normal; no masses,  no organomegaly ?Extremities: extremities normal, atraumatic, no cyanosis or edema ?Skin: Skin color, texture, turgor normal. No rashes or lesions ?Lymph nodes: Cervical, supraclavicular, and axillary nodes normal. ?No abnormal inguinal nodes palpated ?Neurologic: Grossly normal ? ? ?Pelvic: External genitalia:  no lesions ?             Urethra:  normal appearing urethra with no masses, tenderness  or lesions ?             Bartholins and Skenes: normal    ?             Vagina: normal appearing vagina with normal color and no discharge, no lesions ?             Cervix: no lesions ?             Pap taken: Yes.   ?Bimanual Exam:  Uterus:  normal size, contour, position, consistency, mobility, non-tender ?             Adnexa: normal adnexa and no mass, fullness, tenderness ?              Rectovaginal: Confirms ?              Anus:  normal sphincter tone, no lesions ? ?Chaperone, Octaviano Batty, CMA, was present for exam. ? ?Assessment/Plan: ?1. Vaginal atrophy ?- NONFORMULARY OR COMPOUNDED ITEM; Vitamin E vaginal suppositories 200u/ml.  One pv three times weekly.  Dispense: 36 each; Refill: 3 ? ?2. Encounter for screening mammogram for malignant neoplasm of breast ?- MM 3D SCREEN BREAST BILATERAL; Future ? ?3. Family history of breast cancer ?- planning MRI in September.  [place] model done today. ? ?4. Cervical cancer screening ?- Cytology - PAP( [place]) ?- PR OBTAINING SCREEN PAP SMEAR ? ?5. Age-related osteoporosis without current pathological fracture ?- planning BMD with Dr. Sharlett Iles to repeat BMD ?- d/w pt [facility] for treatment ? ?6.  Urine odor ?- urine dip with +Leuk.  Urine culture ordered. ? ? ?

## 2021-05-05 NOTE — Addendum Note (Signed)
Addended by: Jerene Bears on: 05/05/2021 09:53 AM ? ? Modules accepted: Orders ? ?

## 2021-05-05 NOTE — Addendum Note (Signed)
Addended by: Octaviano Batty B on: 05/05/2021 11:34 AM ? ? Modules accepted: Orders ? ?

## 2021-05-06 DIAGNOSIS — H0102A Squamous blepharitis right eye, upper and lower eyelids: Secondary | ICD-10-CM | POA: Diagnosis not present

## 2021-05-06 DIAGNOSIS — H0288B Meibomian gland dysfunction left eye, upper and lower eyelids: Secondary | ICD-10-CM | POA: Diagnosis not present

## 2021-05-06 DIAGNOSIS — H0288A Meibomian gland dysfunction right eye, upper and lower eyelids: Secondary | ICD-10-CM | POA: Diagnosis not present

## 2021-05-06 DIAGNOSIS — H16223 Keratoconjunctivitis sicca, not specified as Sjogren's, bilateral: Secondary | ICD-10-CM | POA: Diagnosis not present

## 2021-05-06 LAB — CYTOLOGY - PAP: Diagnosis: NEGATIVE

## 2021-05-09 ENCOUNTER — Other Ambulatory Visit: Payer: Self-pay | Admitting: Obstetrics and Gynecology

## 2021-05-09 ENCOUNTER — Ambulatory Visit: Payer: Medicare Other | Admitting: Podiatry

## 2021-05-09 ENCOUNTER — Other Ambulatory Visit: Payer: Self-pay

## 2021-05-09 DIAGNOSIS — R202 Paresthesia of skin: Secondary | ICD-10-CM

## 2021-05-09 DIAGNOSIS — M7742 Metatarsalgia, left foot: Secondary | ICD-10-CM | POA: Diagnosis not present

## 2021-05-09 DIAGNOSIS — N3000 Acute cystitis without hematuria: Secondary | ICD-10-CM

## 2021-05-09 DIAGNOSIS — M722 Plantar fascial fibromatosis: Secondary | ICD-10-CM

## 2021-05-09 LAB — URINE CULTURE

## 2021-05-09 MED ORDER — SULFAMETHOXAZOLE-TRIMETHOPRIM 800-160 MG PO TABS: 1.0000 | ORAL_TABLET | Freq: Two times a day (BID) | ORAL | 0 refills | Status: DC

## 2021-05-12 ENCOUNTER — Other Ambulatory Visit (HOSPITAL_BASED_OUTPATIENT_CLINIC_OR_DEPARTMENT_OTHER): Payer: Self-pay | Admitting: Obstetrics & Gynecology

## 2021-05-12 ENCOUNTER — Telehealth (HOSPITAL_BASED_OUTPATIENT_CLINIC_OR_DEPARTMENT_OTHER): Payer: Self-pay | Admitting: Obstetrics & Gynecology

## 2021-05-12 ENCOUNTER — Encounter: Payer: Self-pay | Admitting: Neurology

## 2021-05-12 DIAGNOSIS — N3000 Acute cystitis without hematuria: Secondary | ICD-10-CM

## 2021-05-12 MED ORDER — SULFAMETHOXAZOLE-TRIMETHOPRIM 800-160 MG PO TABS: 1.0000 | ORAL_TABLET | Freq: Two times a day (BID) | ORAL | 0 refills | Status: AC

## 2021-05-12 NOTE — Telephone Encounter (Signed)
Patient called and would like for the nurse to give her a call or have Dr.Miller to call her. ?

## 2021-05-12 NOTE — Progress Notes (Signed)
Subjective: ?66 year old female presents the office today for follow evaluation of heel pain which is doing better but she gets tingling into her feet.  She states that when she sits in a chair like she is doing today and her feet are up she gets tingling to her feet but she does state it goes up her legs as well.  She is also having some nerve type symptoms in her thigh that she reports on her right side.  As far as the pain to her foot she gets discomfort, pain point to submetatarsal 5 on the left foot.  No injuries that she reports. ? ?Objective: ?AAO x3, NAD ?DP/PT pulses palpable bilaterally, CRT less than 3 seconds ?There is no significant tenderness palpation on the course or insertion of plantar fascia today.  There is prominence of the left foot submetatarsal 5 resulting in tenderness at this area and there is minimal edema.  No erythema or warmth.  No other areas of pinpoint tenderness noted today.  Flexor, extensor tendons appear to be intact.  Negative Tinel sign. ?No pain with calf compression, swelling, warmth, erythema ? ?Assessment: ?66 year old female with heel pain, plan fasciitis with metatarsalgia left foot, ? Neuritis  ? ?Plan: ?-All treatment options discussed with the patient including all alternatives, risks, complications.  ?-Her symptoms as far as the tingling to her feet and her legs is not consistent with plantar fasciitis.  At this is an ongoing issue and referred to neurology.  Note that she is at tarsal tunnel she is in no Tinel sign today.  ?-For the left foot pain discussed offloading pads as well as anti-inflammatories as needed.  Dispensed metatarsal pad.  Offered steroid injection today. ?-Plan fasciitis continue stretching, icing as well as wearing shoes and good arch supports. ?-Patient encouraged to call the office with any questions, concerns, change in symptoms.  ? ?Trula Slade DPM ? ?

## 2021-05-14 NOTE — Telephone Encounter (Signed)
Late entry.  Called pt on 3/27.  She is still having symptoms of UTI.  It is improved but feels not resolved.  Has questions about bacteria in culture as well.  Answered these for her.  Decided with pt to continue bactrim DS BID therapy for 5 more days.  Will plan repeat urine culture once this is completed.  Pt will call with updated towards end of week. ?

## 2021-05-15 DIAGNOSIS — H5203 Hypermetropia, bilateral: Secondary | ICD-10-CM | POA: Diagnosis not present

## 2021-05-19 DIAGNOSIS — I10 Essential (primary) hypertension: Secondary | ICD-10-CM | POA: Diagnosis not present

## 2021-05-19 DIAGNOSIS — E785 Hyperlipidemia, unspecified: Secondary | ICD-10-CM | POA: Diagnosis not present

## 2021-05-19 DIAGNOSIS — E559 Vitamin D deficiency, unspecified: Secondary | ICD-10-CM | POA: Diagnosis not present

## 2021-05-19 DIAGNOSIS — R82998 Other abnormal findings in urine: Secondary | ICD-10-CM | POA: Diagnosis not present

## 2021-05-22 ENCOUNTER — Ambulatory Visit
Admission: RE | Admit: 2021-05-22 | Discharge: 2021-05-22 | Disposition: A | Discharge status: Home / Self Care | Payer: Medicare Other | Source: Ambulatory Visit | Attending: Obstetrics & Gynecology | Admitting: Obstetrics & Gynecology

## 2021-05-22 DIAGNOSIS — Z1231 Encounter for screening mammogram for malignant neoplasm of breast: Secondary | ICD-10-CM | POA: Diagnosis not present

## 2021-05-22 DIAGNOSIS — Z803 Family history of malignant neoplasm of breast: Secondary | ICD-10-CM

## 2021-05-26 DIAGNOSIS — D649 Anemia, unspecified: Secondary | ICD-10-CM | POA: Diagnosis not present

## 2021-05-27 DIAGNOSIS — E559 Vitamin D deficiency, unspecified: Secondary | ICD-10-CM | POA: Diagnosis not present

## 2021-05-27 DIAGNOSIS — M810 Age-related osteoporosis without current pathological fracture: Secondary | ICD-10-CM | POA: Diagnosis not present

## 2021-05-27 DIAGNOSIS — R300 Dysuria: Secondary | ICD-10-CM | POA: Diagnosis not present

## 2021-05-27 DIAGNOSIS — Z0000 Encounter for general adult medical examination without abnormal findings: Secondary | ICD-10-CM | POA: Diagnosis not present

## 2021-06-03 DIAGNOSIS — R300 Dysuria: Secondary | ICD-10-CM | POA: Diagnosis not present

## 2021-06-03 DIAGNOSIS — E785 Hyperlipidemia, unspecified: Secondary | ICD-10-CM | POA: Diagnosis not present

## 2021-06-04 ENCOUNTER — Encounter (HOSPITAL_BASED_OUTPATIENT_CLINIC_OR_DEPARTMENT_OTHER): Payer: Self-pay | Admitting: Obstetrics & Gynecology

## 2021-06-04 ENCOUNTER — Ambulatory Visit (INDEPENDENT_AMBULATORY_CARE_PROVIDER_SITE_OTHER): Payer: Medicare Other

## 2021-06-04 ENCOUNTER — Ambulatory Visit (HOSPITAL_BASED_OUTPATIENT_CLINIC_OR_DEPARTMENT_OTHER): Payer: Medicare Other | Admitting: Obstetrics & Gynecology

## 2021-06-04 VITALS — BP 133/63 | HR 62 | Ht 62.5 in | Wt 103.6 lb

## 2021-06-04 DIAGNOSIS — Z9189 Other specified personal risk factors, not elsewhere classified: Secondary | ICD-10-CM

## 2021-06-04 DIAGNOSIS — N889 Noninflammatory disorder of cervix uteri, unspecified: Secondary | ICD-10-CM

## 2021-06-04 DIAGNOSIS — N888 Other specified noninflammatory disorders of cervix uteri: Secondary | ICD-10-CM | POA: Diagnosis not present

## 2021-06-06 ENCOUNTER — Telehealth: Payer: Self-pay | Admitting: Hematology and Oncology

## 2021-06-06 NOTE — Telephone Encounter (Signed)
Scheduled appt per 4/21 referral. Pt is aware of appt date and time. Pt is aware to arrive 15 mins prior to appt time and to bring and updated insurance card. Pt is aware of appt location.   ?

## 2021-06-07 NOTE — Progress Notes (Signed)
GYNECOLOGY  VISIT ? ?CC:   follow up after ultrasound ? ?HPI: ?66 y.o. G56P0101 Married White or Caucasian female here for follow up after having ultrasound. This was performed due to abnormal appearance of cervix noted at first exam with pt.  Either a cervical cyst or polyp present on exam so ultrasound recommended.  Pt does updated normal pap smear.  Ultrasound reviewed.  She has a simple appearing nabothian cyst present.  Pt aware no treatment is needed for not.  Would not recommend removal.  All questions answered. ? ?Pt also wants to talk about significant breast density and additional breast cancer screening.  MMG is up to date.  She does have intermediate risk for breast cancer.  Screening breast MRI recommended.  Pt would like to proceed with this.. ? ? ?Patient Active Problem List  ? Diagnosis Date Noted  ? Age-related osteoporosis without current pathological fracture 05/05/2021  ? History of recurrent UTIs 12/29/2018  ? Status post laparoscopic cholecystectomy Jan 2020 02/22/2018  ? Menopause 07/17/2011  ? Essential hypertension 01/05/2011  ? ? ?Past Medical History:  ?Diagnosis Date  ? Anxiety   ? BRCA gene mutation negative   ? Chronic constipation   ? GERD (gastroesophageal reflux disease)   ? Headache(784.0)   ? Hypertension   ? PONV (postoperative nausea and vomiting)   ? Wears contact lenses   ? ? ?Past Surgical History:  ?Procedure Laterality Date  ? BREAST EXCISIONAL BIOPSY Right 06-26-1999    dr Truitt Leep  _0   ? CHOLECYSTECTOMY N/A 02/22/2018  ? Procedure: LAPAROSCOPIC CHOLECYSTECTOMY WITH INTRAOPERATIVE CHOLANGIOGRAM ERAS PATHWAY;  Surgeon: Johnathan Hausen, MD;  Location: [facility];  Service: General;  Laterality: N/A;  ? D & C HYSTEROSCOPY WITH RESECTION POLYPS  10-10-2009    dr rivard  _1   ? DILATION AND CURETTAGE OF UTERUS  2006  ? WISDOM TOOTH EXTRACTION    ? ? ?MEDS:   ?Current Outpatient Medications on File Prior to Visit  ?Medication Sig Dispense Refill  ? ALPRAZolam (XANAX) 0.5 MG tablet  Take 0.5 mg by mouth 2 (two) times daily as needed for anxiety or sleep.     ? Calcium-Phosphorus-Vitamin D (CITRACAL +D3 PO) Take by mouth.    ? Metoprolol Succinate 100 MG CS24 Take 50 mg by mouth 2 (two) times daily.    ? NONFORMULARY OR COMPOUNDED ITEM Estradiol/vitamin e 0.02%/200IU/GM    ? NONFORMULARY OR COMPOUNDED ITEM Vitamin E vaginal suppositories 200u/ml.  One pv three times weekly. 36 each 3  ? Omega-3 Fatty Acids (OMEGA-3 2100 PO) Take by mouth.    ? Vitamin D, Ergocalciferol, (DRISDOL) 1.25 MG (50000 UNIT) CAPS capsule Take 50,000 Units by mouth every 7 (seven) days.    ? ?No current facility-administered medications on file prior to visit.  ? ? ?ALLERGIES: Tetracyclines & related, Penicillins, and Erythromycin ? ?Family History  ?Problem Relation Age of Onset  ? Stroke Maternal Grandmother   ? Asthma Father   ? Heart disease Mother   ? Hypertension Mother   ? Thyroid disease Mother   ? Breast cancer Mother 66  ? Heart disease Brother   ? ADD / ADHD Son   ? Other Son   ?     mentally handicaped  ? Breast cancer Cousin 30  ? Colon cancer Cousin   ? Colon cancer Cousin   ? ? ?SH:  married, non smoker ? ?Review of Systems  ?All other systems reviewed and are negative. ? ?PHYSICAL EXAMINATION:   ? ?BP  133/63 (BP Location: Left Arm, Patient Position: Sitting, Cuff Size: Normal)   Pulse 62   Ht 5' 2.5" (1.588 m) Comment: reported  Wt 103 lb 9.6 oz (47 kg)   LMP 07/24/2010   BMI 18.65 kg/m?     ?Physical Exam ?Constitutional:   ?   Appearance: Normal appearance.  ?Neurological:  ?   General: No focal deficit present.  ?   Mental Status: She is alert.  ?Psychiatric:     ?   Mood and Affect: Mood normal.  ? ? ?Assessment/Plan: ?1. Nabothian cyst ?- routine follow up recommended ? ?2. At increased risk of breast cancer ?- MR BREAST W & WO CM SCREENING (GI); Future  ? ? ? ?

## 2021-06-09 DIAGNOSIS — H5203 Hypermetropia, bilateral: Secondary | ICD-10-CM | POA: Diagnosis not present

## 2021-06-12 ENCOUNTER — Ambulatory Visit
Admission: RE | Admit: 2021-06-12 | Discharge: 2021-06-12 | Disposition: A | Discharge status: Home / Self Care | Payer: Medicare Other | Source: Ambulatory Visit | Attending: Obstetrics & Gynecology | Admitting: Obstetrics & Gynecology

## 2021-06-12 DIAGNOSIS — Z9189 Other specified personal risk factors, not elsewhere classified: Secondary | ICD-10-CM

## 2021-06-12 DIAGNOSIS — N6489 Other specified disorders of breast: Secondary | ICD-10-CM | POA: Diagnosis not present

## 2021-06-12 MED ORDER — GADOBUTROL 1 MMOL/ML IV SOLN
5.0000 mL | Freq: Once | INTRAVENOUS | Status: AC | PRN
  Administered 2021-06-12: 5 mL via INTRAVENOUS

## 2021-06-17 ENCOUNTER — Inpatient Hospital Stay: Payer: Medicare Other | Attending: Hematology and Oncology | Admitting: Hematology and Oncology

## 2021-06-17 ENCOUNTER — Other Ambulatory Visit: Payer: Self-pay

## 2021-06-17 ENCOUNTER — Inpatient Hospital Stay: Payer: Medicare Other

## 2021-06-17 ENCOUNTER — Encounter: Payer: Self-pay | Admitting: Hematology and Oncology

## 2021-06-17 VITALS — BP 120/86 | HR 60 | Temp 97.6°F | Resp 16 | Ht 62.5 in | Wt 104.1 lb

## 2021-06-17 DIAGNOSIS — Z803 Family history of malignant neoplasm of breast: Secondary | ICD-10-CM | POA: Insufficient documentation

## 2021-06-17 DIAGNOSIS — D649 Anemia, unspecified: Secondary | ICD-10-CM | POA: Insufficient documentation

## 2021-06-17 DIAGNOSIS — I10 Essential (primary) hypertension: Secondary | ICD-10-CM | POA: Insufficient documentation

## 2021-06-17 DIAGNOSIS — F840 Autistic disorder: Secondary | ICD-10-CM | POA: Diagnosis not present

## 2021-06-17 DIAGNOSIS — D696 Thrombocytopenia, unspecified: Secondary | ICD-10-CM | POA: Insufficient documentation

## 2021-06-17 DIAGNOSIS — Z800 Family history of malignant neoplasm of digestive organs: Secondary | ICD-10-CM | POA: Insufficient documentation

## 2021-06-17 LAB — COMPREHENSIVE METABOLIC PANEL
ALT: 14 U/L (ref 0–44)
AST: 19 U/L (ref 15–41)
Albumin: 4.3 g/dL (ref 3.5–5.0)
Alkaline Phosphatase: 65 U/L (ref 38–126)
Anion gap: 3 — ABNORMAL LOW (ref 5–15)
BUN: 13 mg/dL (ref 8–23)
CO2: 30 mmol/L (ref 22–32)
Calcium: 9.6 mg/dL (ref 8.9–10.3)
Chloride: 107 mmol/L (ref 98–111)
Creatinine, Ser: 0.63 mg/dL (ref 0.44–1.00)
GFR, Estimated: >60 mL/min (ref >60)
Glucose, Bld: 84 mg/dL (ref 70–99)
Potassium: 3.9 mmol/L (ref 3.5–5.1)
Sodium: 140 mmol/L (ref 135–145)
Total Bilirubin: 0.5 mg/dL (ref 0.3–1.2)
Total Protein: 7.3 g/dL (ref 6.5–8.1)

## 2021-06-17 LAB — RETICULOCYTES
Immature Retic Fract: 9.9 % (ref 2.3–15.9)
RBC.: 3.91 MIL/uL (ref 3.87–5.11)
Retic Count, Absolute: 70 10*3/uL (ref 19.0–186.0)
Retic Ct Pct: 1.8 % (ref 0.4–3.1)

## 2021-06-17 LAB — CBC WITH DIFFERENTIAL/PLATELET
Abs Immature Granulocytes: 0.01 10*3/uL (ref 0.00–0.07)
Basophils Absolute: 0 10*3/uL (ref 0.0–0.1)
Basophils Relative: 1 %
Eosinophils Absolute: 0.3 10*3/uL (ref 0.0–0.5)
Eosinophils Relative: 6 %
HCT: 35.5 % — ABNORMAL LOW (ref 36.0–46.0)
Hemoglobin: 11.6 g/dL — ABNORMAL LOW (ref 12.0–15.0)
Immature Granulocytes: 0 %
Lymphocytes Relative: 39 %
Lymphs Abs: 1.8 10*3/uL (ref 0.7–4.0)
MCH: 29.1 pg (ref 26.0–34.0)
MCHC: 32.7 g/dL (ref 30.0–36.0)
MCV: 89 fL (ref 80.0–100.0)
Monocytes Absolute: 0.4 10*3/uL (ref 0.1–1.0)
Monocytes Relative: 8 %
Neutro Abs: 2.2 10*3/uL (ref 1.7–7.7)
Neutrophils Relative %: 46 %
Platelets: 168 10*3/uL (ref 150–400)
RBC: 3.99 MIL/uL (ref 3.87–5.11)
RDW: 12.7 % (ref 11.5–15.5)
WBC: 4.7 10*3/uL (ref 4.0–10.5)
nRBC: 0 % (ref 0.0–0.2)

## 2021-06-17 LAB — TSH: TSH: 1.168 u[IU]/mL (ref 0.350–4.500)

## 2021-06-17 LAB — LACTATE DEHYDROGENASE: LDH: 102 U/L (ref 98–192)

## 2021-06-17 NOTE — Assessment & Plan Note (Signed)
This is a very pleasant 66 year old female patient with past medical history is significant for hypertension referred to hematology for evaluation of mild normocytic normochromic anemia.  Patient arrived to the appointment today with her husband.  She complains of ongoing profound fatigue and hence her PCP has referred to hematology.  Upon review of labs for the past 4 years, her hemoglobin has almost always stayed around 11.5 g/dL ?Her anemia is normocytic normochromic, no evidence of iron deficiency ferritin at 151, no evidence of B12 deficiency. ?CMP completely normal hence anemia is unlikely related to anemia of chronic kidney disease. ?Serum electrophoresis normal, no evidence of M protein. ?I have ordered some additional labs including folic acid, hemolysis labs, TSH, ANA today.  I explained to her that her fatigue is likely unrelated to her anemia since that this has been her normal for the past few years.  This is most likely because of stress and lack of sleep with her family stressors.  I have encouraged her to proceed with labs today and return to clinic in 3 to 4 months.  She is up-to-date with age-appropriate cancer screening.  If she has worsening clinical symptoms or progressive cytopenias, we will consider additional investigation. ?

## 2021-06-17 NOTE — Assessment & Plan Note (Signed)
Mild thrombocytopenia noted on one of the labs with platelet count of 138,000.  Labs from prior show normal platelet count, low normal most of the visits.  Again I do not believe this is related to Kayla Guerra fatigue.  We agreed to repeat this today.  One-time low level of platelet count could be related to concomitant UTI or antibiotics. ?

## 2021-06-17 NOTE — Progress Notes (Signed)
[place] ?CONSULT NOTE ? ?Patient Care Team: ?Donnajean Lopes, MD as PCP - General (Internal Medicine) ? ?CHIEF COMPLAINTS/PURPOSE OF CONSULTATION:  ?Normocytic normochromic anemia. ? ?ASSESSMENT & PLAN:  ?Normocytic normochromic anemia ?This is a very pleasant 66 year old female patient with past medical history is significant for hypertension referred to hematology for evaluation of mild normocytic normochromic anemia.  Patient arrived to the appointment today with her husband.  She complains of ongoing profound fatigue and hence her PCP has referred to hematology.  Upon review of labs for the past 4 years, her hemoglobin has almost always stayed around 11.5 g/dL ?Her anemia is normocytic normochromic, no evidence of iron deficiency ferritin at 151, no evidence of B12 deficiency. ?CMP completely normal hence anemia is unlikely related to anemia of chronic kidney disease. ?Serum electrophoresis normal, no evidence of M protein. ?I have ordered some additional labs including folic acid, hemolysis labs, TSH, ANA today.  I explained to her that her fatigue is likely unrelated to her anemia since that this has been her normal for the past few years.  This is most likely because of stress and lack of sleep with her family stressors.  I have encouraged her to proceed with labs today and return to clinic in 3 to 4 months.  She is up-to-date with age-appropriate cancer screening.  If she has worsening clinical symptoms or progressive cytopenias, we will consider additional investigation. ? ?Thrombocytopenia ([place]) ?Mild thrombocytopenia noted on one of the labs with platelet count of 138,000.  Labs from prior show normal platelet count, low normal most of the visits.  Again I do not believe this is related to her fatigue.  We agreed to repeat this today.  One-time low level of platelet count could be related to concomitant UTI or antibiotics. ? ?Age-appropriate cancer screening up-to-date. ?Orders Placed  This Encounter  ?Procedures  ? CBC with Differential/Platelet  ?  Standing Status:   Standing  ?  Number of Occurrences:   71  ?  Standing Expiration Date:   06/18/2022  ? Lactate dehydrogenase  ?  Standing Status:   Future  ?  Standing Expiration Date:   06/18/2022  ? Reticulocytes  ?  Standing Status:   Future  ?  Standing Expiration Date:   06/18/2022  ? Folate RBC  ?  Standing Status:   Future  ?  Standing Expiration Date:   06/18/2022  ? TSH  ?  Standing Status:   Standing  ?  Number of Occurrences:   106  ?  Standing Expiration Date:   06/18/2022  ? ANA, IFA (with reflex)  ?  Standing Status:   Future  ?  Standing Expiration Date:   06/17/2022  ? Comprehensive metabolic panel  ?  Standing Status:   Standing  ?  Number of Occurrences:   41  ?  Standing Expiration Date:   06/18/2022  ? ? ? ?HISTORY OF PRESENTING ILLNESS:  ?Kayla Guerra 66 y.o. female is here because of normocytic normochromic anemia. ? ?This is a very pleasant 66 year old female patient with past medical history significant for hypertension, headaches referred to hematology because of ongoing fatigue and mild normocytic normochromic anemia.  [name] arrived to the appointment today with her husband.  She tells me that she has been feeling very tired but she did not realize that she was also having a urinary tract infection at the same time, also recently had a dental procedure and could not eat or drink  much for about 4 weeks.  She also has a mentally disabled son who has autistic and behavioral problems and hence is very stressed most days and does not get much sleep.  She also was working about 80 hours a week and she retired 3 years earlier.  Mom had breast cancer at the age of 2, patient undergoes mammogram and MRI screening regularly, most recent screening without any evidence of malignancy.  She is otherwise up-to-date with age-appropriate cancer screening.  She has been eating iron rich foods lately because she was wondering about iron  deficiency.  Rest of the pertinent 10 point ROS reviewed and negative ? ? ?MEDICAL HISTORY:  ?Past Medical History:  ?Diagnosis Date  ? Anxiety   ? BRCA gene mutation negative   ? Chronic constipation   ? GERD (gastroesophageal reflux disease)   ? Headache(784.0)   ? Hypertension   ? PONV (postoperative nausea and vomiting)   ? Wears contact lenses   ? ? ?SURGICAL HISTORY: ?Past Surgical History:  ?Procedure Laterality Date  ? BREAST EXCISIONAL BIOPSY Right 06-26-1999    dr Truitt Leep  _0   ? CHOLECYSTECTOMY N/A 02/22/2018  ? Procedure: LAPAROSCOPIC CHOLECYSTECTOMY WITH INTRAOPERATIVE CHOLANGIOGRAM ERAS PATHWAY;  Surgeon: Johnathan Hausen, MD;  Location: [facility];  Service: General;  Laterality: N/A;  ? D & C HYSTEROSCOPY WITH RESECTION POLYPS  10-10-2009    dr rivard  _1   ? DILATION AND CURETTAGE OF UTERUS  2006  ? WISDOM TOOTH EXTRACTION    ? ? ?SOCIAL HISTORY: ?Social History  ? ?Socioeconomic History  ? Marital status: Married  ?  Spouse name: Not on file  ? Number of children: 1  ? Years of education: Not on file  ? Highest education level: Not on file  ?Occupational History  ? 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 Smoking status: Never  ? Smokeless tobacco: Never  ?Vaping Use  ? Vaping Use: Never used  ?Substance and Sexual Activity  ? Alcohol use: Not Currently  ?  Comment: occasional  ? Drug use: Never  ? Sexual activity: Yes  ?  Birth control/protection: Surgical  ?  Comment: vas  ?Other Topics Concern  ? Not on file  ?Social History Narrative  ? Not on file  ? ?Social Determinants of Health  ? ?Financial Resource Strain: Not on file  ?Food Insecurity: Not on file  ?Transportation Needs: Not on file  ?Physical Activity: Not on file  ?Stress: Not on file  ?Social Connections: Not on file  ?Intimate Partner Violence: Not on file  ? ? ?FAMILY HISTORY: ?Family History  ?Problem Relation Age of Onset  ? Stroke Maternal Grandmother   ? Asthma Father   ? Heart disease Mother   ? Hypertension Mother    ? Thyroid disease Mother   ? Breast cancer Mother 23  ? Heart disease Brother   ? ADD / ADHD Son   ? Other Son   ?     mentally handicaped  ? Breast cancer Cousin 30  ? Colon cancer Cousin   ? Colon cancer Cousin   ? ? ?ALLERGIES:  is allergic to tetracyclines & related, penicillins, and erythromycin. ? ?MEDICATIONS:  ?Current Outpatient Medications  ?Medication Sig Dispense Refill  ? ALPRAZolam (XANAX) 0.5 MG tablet Take 0.5 mg by mouth 2 (two) times daily as needed for anxiety or sleep.     ? Calcium-Phosphorus-Vitamin D (CITRACAL +D3 PO) Take by mouth.    ? Metoprolol Succinate 100 MG CS24 Take  50 mg by mouth 2 (two) times daily.    ? NONFORMULARY OR COMPOUNDED ITEM Estradiol/vitamin e 0.02%/200IU/GM    ? NONFORMULARY OR COMPOUNDED ITEM Vitamin E vaginal suppositories 200u/ml.  One pv three times weekly. 36 each 3  ? Omega-3 Fatty Acids (OMEGA-3 2100 PO) Take by mouth.    ? Vitamin D, Ergocalciferol, (DRISDOL) 1.25 MG (50000 UNIT) CAPS capsule Take 50,000 Units by mouth every 7 (seven) days.    ? ?No current facility-administered medications for this visit.  ? ? ? ?PHYSICAL EXAMINATION: ?ECOG PERFORMANCE STATUS: 0 - Asymptomatic ? ?Vitals:  ? 06/17/21 0955  ?BP: 120/86  ?Pulse: 60  ?Resp: 16  ?Temp: 97.6 ?F (36.4 ?C)  ?SpO2: 98%  ? ?Filed Weights  ? 06/17/21 0955  ?Weight: 104 lb 1.6 oz (47.2 kg)  ? ? ?GENERAL:alert, no distress and comfortable ?SKIN: skin color, texture, turgor are normal, no rashes or significant lesions ?EYES: normal, conjunctiva are pink and non-injected, sclera clear ?OROPHARYNX:no exudate, no erythema and lips, buccal mucosa, and tongue normal  ?NECK: supple, thyroid normal size, non-tender, without nodularity ?LYMPH:  no palpable lymphadenopathy in the cervical, axillary or inguinal ?LUNGS: clear to auscultation and percussion with normal breathing effort ?HEART: regular rate & rhythm and no murmurs and no lower extremity edema ?ABDOMEN:abdomen soft, non-tender and normal bowel  sounds ?Musculoskeletal:no cyanosis of digits and no clubbing  ?PSYCH: alert & oriented x 3 with fluent speech ?NEURO: no focal motor/sensory deficits ? ?LABORATORY DATA:  ?I have reviewed the data as listed ?Lab Resu

## 2021-06-18 LAB — FOLATE RBC
Folate, Hemolysate: 291 ng/mL
Folate, RBC: 839 ng/mL (ref >498)
Hematocrit: 34.7 % (ref 34.0–46.6)

## 2021-06-18 LAB — ANTINUCLEAR ANTIBODIES, IFA: ANA Ab, IFA: NEGATIVE

## 2021-07-25 ENCOUNTER — Encounter: Payer: Self-pay | Admitting: Internal Medicine

## 2021-09-01 DIAGNOSIS — K610 Anal abscess: Secondary | ICD-10-CM | POA: Diagnosis not present

## 2021-09-19 ENCOUNTER — Encounter: Payer: Self-pay | Admitting: Neurology

## 2021-09-19 ENCOUNTER — Ambulatory Visit: Payer: Medicare Other | Admitting: Neurology

## 2021-09-19 VITALS — BP 116/77 | HR 68 | Wt 101.0 lb

## 2021-09-19 DIAGNOSIS — M5416 Radiculopathy, lumbar region: Secondary | ICD-10-CM | POA: Diagnosis not present

## 2021-09-19 DIAGNOSIS — M79672 Pain in left foot: Secondary | ICD-10-CM | POA: Diagnosis not present

## 2021-09-19 DIAGNOSIS — M79671 Pain in right foot: Secondary | ICD-10-CM | POA: Diagnosis not present

## 2021-09-19 DIAGNOSIS — R202 Paresthesia of skin: Secondary | ICD-10-CM | POA: Diagnosis not present

## 2021-09-19 MED ORDER — GABAPENTIN 100 MG PO CAPS: ORAL_CAPSULE | ORAL | 0 refills | Status: DC

## 2021-09-19 NOTE — Progress Notes (Signed)
[facility] Clinic Note - Initial Visit   Date: 09/19/21  Kayla Guerra MRN: [id number] DOB: [birth year]   Dear Dr. Jacqualyn Posey:   Thank you for your kind referral of Kayla Guerra for consultation of bilateral feet and leg tingling. Although her history is well known to you, please allow Kayla Guerra to reiterate it for the purpose of our medical record. The patient was accompanied to the clinic by self.    History of Present Illness: Kayla Guerra is a 66 y.o. right-handed female with anxiety, GERD, and hypertension presenting for evaluation of bilateral feet tingling.   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  She has a 2 year-old son with disability that she care for at home. In April 2022, she began having sharp pain over bilateral heel, burning over the soles, and achiness of the lower legs.  Pain can radiate up her legs.  Symptoms are worse with prolonged standing.  Rest alleviates her pain.  No back pain or falls.  She has not tried physical therapy or medications.  She was evaluated by podiatry for these symptoms who treated her for plantar fasciitis and recommended neurological work-up for feet paresthesias.  She has a long history of benign essential tremors affecting the hands and previously saw Dr. Erling Cruz.  She opted against taking medications.  Mother also has history of hand tremors.   Out-side paper records, electronic medical record, and images have been reviewed where available and summarized as:  Lab Results  Component Value Date   TSH 1.168 06/17/2021    Past Medical History:  Diagnosis Date   Anxiety    BRCA gene mutation negative    Chronic constipation    GERD (gastroesophageal reflux disease)    Headache(784.0)    Hypertension    PONV (postoperative nausea and vomiting)    Wears contact lenses     Past Surgical History:  Procedure Laterality Date   BREAST EXCISIONAL  BIOPSY Right 06-26-1999    dr Truitt Leep  _0    CHOLECYSTECTOMY N/A 02/22/2018   Procedure: LAPAROSCOPIC CHOLECYSTECTOMY WITH INTRAOPERATIVE CHOLANGIOGRAM ERAS PATHWAY;  Surgeon: Johnathan Hausen, MD;  Location: [facility];  Service: General;  Laterality: N/A;   D & C HYSTEROSCOPY WITH RESECTION POLYPS  10-10-2009    dr rivard  _1    DILATION AND CURETTAGE OF UTERUS  2006   WISDOM TOOTH EXTRACTION       Medications:  Outpatient Encounter Medications as of 09/19/2021  Medication Sig   ALPRAZolam (XANAX) 0.5 MG tablet Take 0.5 mg by mouth 2 (two) times daily as needed for anxiety or sleep.    Calcium-Phosphorus-Vitamin D (CITRACAL +D3 PO) Take by mouth.   NONFORMULARY OR COMPOUNDED ITEM Estradiol/vitamin e 0.02%/200IU/GM   NONFORMULARY OR COMPOUNDED ITEM Vitamin E vaginal suppositories 200u/ml.  One pv three times weekly.   Omega-3 Fatty Acids (OMEGA-3 2100 PO) Take by mouth.   Vitamin D, Ergocalciferol, (DRISDOL) 1.25 MG (50000 UNIT) CAPS capsule Take 50,000 Units by mouth every 7 (seven) days.   Metoprolol Succinate 100 MG CS24 Take 50 mg by mouth 2 (two) times daily. (Patient not taking: Reported on 09/19/2021)   No facility-administered encounter medications on file as of 09/19/2021.    Allergies:  Allergies  Allergen Reactions   Tetracycline Other (See Comments), Rash and Swelling    Throat inflammed   Tetracyclines & Related Other (See Comments)    Throat inflammed   Penicillins  As child DID THE REACTION INVOLVE: Swelling of the face/tongue/throat, SOB, or low BP? Unknown Sudden or severe rash/hives, skin peeling, or the inside of the mouth or nose? Unknown Did it require medical treatment? Unknown When did it last happen?   childhood    If all above answers are "NO", may proceed with cephalosporin use.    Erythromycin Other (See Comments)    As child - unknown    Family History: Family History  Problem Relation Age of Onset   Stroke Maternal Grandmother    Asthma Father     Heart disease Mother    Hypertension Mother    Thyroid disease Mother    Breast cancer Mother 28   Heart disease Brother    ADD / ADHD Son    Other Son        mentally handicaped   Breast cancer Cousin 71   Colon cancer Cousin    Colon cancer Cousin     Social History: Social History   Tobacco Use   Smoking status: Never   Smokeless tobacco: Never  Vaping Use   Vaping Use: Never used  Substance Use Topics   Alcohol use: Not Currently    Comment: occasional   Drug use: Never   Social History   Social History Narrative   Right handed   Drinks caffeine   One story home    Vital Signs:  BP 116/77   Pulse 68   Wt 101 lb (45.8 kg)   LMP 07/24/2010   SpO2 99%   BMI 18.18 kg/m    Neurological Exam: MENTAL STATUS including orientation to time, place, person, recent and remote memory, attention span and concentration, language, and fund of knowledge is normal.  Speech is not dysarthric.  CRANIAL NERVES: II:  No visual field defects.    III-IV-VI: Pupils equal round and reactive to light.  Normal conjugate, extra-ocular eye movements in all directions of gaze.  No nystagmus.  No ptosis.   V:  Normal facial sensation.    VII:  Normal facial symmetry and movements.   VIII:  Normal hearing and vestibular function.   IX-X:  Normal palatal movement.   XI:  Normal shoulder shrug and head rotation.   XII:  Normal tongue strength and range of motion, no deviation or fasciculation.  MOTOR:  No atrophy or fasciculations.  Bilateral intention hand tremor worse with activity, absent at rest. .  No pronator drift.   Upper Extremity:  Right  Left  Deltoid  5/5   5/5   Biceps  5/5   5/5   Triceps  5/5   5/5   Finger extensors  5/5   5/5   Finger flexors  5/5   5/5   Dorsal interossei  5/5   5/5   Abductor pollicis  5/5   5/5   Tone (Ashworth scale)  0  0   Lower Extremity:  Right  Left  Hip flexors  5/5   5/5   Hip extensors  5/5   5/5   Adductor 5/5  5/5  Abductor  5/5  5/5  Knee flexors  5/5   5/5   Knee extensors  5/5   5/5   Dorsiflexors  5/5   5/5   Plantarflexors  5/5   5/5   Toe extensors  5/5   5/5   Toe flexors  5-/5   5-/5   Tone (Ashworth scale)  0  0   MSRs:  Right  Left                  brachioradialis 2+  2+  biceps 2+  2+  triceps 2+  2+  patellar 2+  2+  ankle jerk 2+  2+  Hoffman no  no  plantar response down  down   SENSORY:  Normal and symmetric perception of light touch, pinprick, vibration, and proprioception.     COORDINATION/GAIT: Normal finger-to- nose-finger.  Intact rapid alternating movements bilaterally.  Gait appears mildly antalgic, slow, unassisted.    IMPRESSION: Bilateral feet paresthesias suggestive of S1 radiculopathy.  No exam findings to support neuropathy or tarsal tunnel syndrome  - Start PT for low back strengthening  - Start gabapentin 136m at bedtime x 1 week, then titrate by 101mweek to 30031mt bedtime  - Call with update in 6-8 weeks  - If no improvement, consider MRI lumbar spine and/or EMG   2.  Essential tremor of the hands  - Medications declined  - Monitor  Return to clinic in 3-4 months.    Thank you for allowing me to participate in patient's care.  If I can answer any additional questions, I would be pleased to do so.    Sincerely,    Donika K. PatPosey ProntoO

## 2021-09-19 NOTE — Patient Instructions (Addendum)
Start physical therapy for low back stretching  Start gabapentin 100mg  at bedtime x 1 week, then 200mg  at bedtime x 1, 300mg  at bedtime.  If you tolerate gabapentin 300mg  at bedtime, please call the office and we can send 300mg  tablets  Update me in 6-8 weeks  Return to clinic 3-4 months

## 2021-09-29 ENCOUNTER — Ambulatory Visit: Payer: Self-pay | Admitting: General Surgery

## 2021-09-29 DIAGNOSIS — K603 Anal fistula: Secondary | ICD-10-CM | POA: Diagnosis not present

## 2021-09-29 NOTE — H&P (Signed)
PROVIDER:  Elenora Gamma, MD   MRN: [id number] DOB: 13-Apr-1955 DATE OF ENCOUNTER: 09/29/2021   Subjective   Chief Complaint: perianal abscess       History of Present Illness:   Kayla Guerra is a 66 y.o. female who is presents per request of Dr Jarold Motto for a possible perianal abscess.  She states she has had at least 4 occurrences since February 2023.  She describes a firm elongated area of swelling in the perianal region that ruptures and bleeds and usually resolves spontaneously.  She denies fever.  She states she has chronic constipation and has tried multiple laxatives, stool softeners, and prescription medications continues to struggle with her bowel movements.  She drinks plenty of water.   She states she is being seen for low platelets and anemia.  She had a colonoscopy by Dr. Marina Goodell in 2018 which showed internal hemorrhoids.  Upper endoscopy was normal.  She sees him on August 15 to discuss possible sources of bleeding causing her anemia.   Review of Systems: A complete review of systems was obtained from the patient.  I have reviewed this information and discussed as appropriate with the patient.  See HPI as well for other ROS.       Medical History: Past Medical History Past Medical History: Diagnosis Date  Anemia        Patient Active Problem List Diagnosis  Postmenopausal osteoporosis  Sun-damaged skin  Essential hypertension  Anxiety about health  Caregiver stress     Past Surgical History Past Surgical History: Procedure Laterality Date  breast lump removal      CHOLECYSTECTOMY          Allergies Allergies Allergen Reactions  Penicillins Rash, Swelling, Unknown and Other (See Comments)     As child   As child  DID THE REACTION INVOLVE: Swelling of the face/tongue/throat, SOB, or low BP? Unknown  Sudden or severe rash/hives, skin peeling, or the inside of the mouth or nose? Unknown  Did it require medical treatment? Unknown  When  did it last happen?   childhood     If all above answers are "NO", may proceed with cephalosporin use.  Tetracycline Rash, Swelling and Other (See Comments)     Throat inflammed  Erythromycin Unknown     As child As child        Current Outpatient Medications on File Prior to Visit Medication Sig Dispense Refill  ALPRAZolam (XANAX) 0.5 MG tablet Take 0.5 mg by mouth 2 (two) times daily as needed   3  ascorbic acid (VITAMIN C ORAL) Vitamin C (Patient not taking: Reported on 09/01/2021)      B-complex with vitamin C (VITAMIN B COMPLEX-C ORAL) vitamin B complex (Patient not taking: Reported on 09/01/2021)      ergocalciferol, vitamin D2, 50,000 unit capsule Take 50,000 Units by mouth once a week (Patient not taking: Reported on 09/01/2021)   3  metoprolol succinate (TOPROL-XL) 100 MG XL tablet Take 100 mg by mouth once daily 50mg  in a.m , 50mg  in evening   (Patient not taking: Reported on 09/01/2021)   5  metoprolol succinate (TOPROL-XL) 50 MG XL tablet Take 25 mg by mouth 2 (two) times daily      multivitamin tablet Take 4 tablets by mouth once daily 4 Capsules. Raw Calcium with calcium, magnesium, Vit D3 and K2 MK-7 (Patient not taking: Reported on 06/12/2020)      omega-3/dha/epa/fish oil (OMEGA-3 ORAL) Omega 3 (Patient not taking: Reported on 09/01/2021)  tretinoin (RETIN-A) 0.025 % cream APPLY PEA SIZED AMOUNT TO ENTIRE FACE NIGHTLY AS TOLERATED (Patient not taking: Reported on 09/01/2021)      varenicline (TYRVAYA) 0.03 mg/spray sprm Place 0.03 mg into one nostril 2 (two) times daily (Patient not taking: Reported on 09/01/2021) 4.2 mL 2  VITAMIN D3 ORAL Vitamin D3 (Patient not taking: Reported on 09/01/2021)      ZINC ACETATE ORAL zinc (Patient not taking: Reported on 09/01/2021)       No current facility-administered medications on file prior to visit.     Family History Family History Problem Relation Age of Onset  Hyperlipidemia (Elevated cholesterol) Mother    Thyroid  disease Mother    Cataracts Mother    Breast cancer Mother    Alzheimer's disease Mother    High blood pressure (Hypertension) Mother    Angina Mother    High blood pressure (Hypertension) Father    Asthma Father    High blood pressure (Hypertension) Sister    Hyperlipidemia (Elevated cholesterol) Sister    Thyroid disease Sister         sisters x 2  High blood pressure (Hypertension) Brother    Hyperlipidemia (Elevated cholesterol) Brother    Retinal degeneration Brother         possible retinitis pigmentosa  Blindness Brother    Rheum arthritis Neg Hx    Diabetes Neg Hx    Diabetes type I Neg Hx    Diabetes type II Neg Hx    Glaucoma Neg Hx        Social History   Tobacco Use Smoking Status Never Smokeless Tobacco Never     Social History Social History    Socioeconomic History  Marital status: Married  Highest education level: Some college, no degree Tobacco Use  Smoking status: Never  Smokeless tobacco: Never Substance and Sexual Activity  Alcohol use: Not Currently  Drug use: Never Social History Narrative   Hobbies: walking, exercising, dancing, reading   Pt has disabled son; pt will be permanent caretaker the duration.       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, retired 2020    Social Determinants of Manufacturing engineer Strain: Low Risk   Difficulty of Paying Living Expenses: Not hard at all Food Insecurity: No Food Insecurity  Worried About Programme researcher, broadcasting/film/video in the Last Year: Never true  Barista in the Last Year: Never true Transportation Needs: No Transportation Needs  Lack of Transportation (Medical): No  Lack of Transportation (Non-Medical): No Physical Activity: Sufficiently Active  Days of Exercise per Week: 6 days  Minutes of Exercise per Session: 30 min Stress: No Stress Concern Present  Feeling of Stress : Only a little      Objective:     There were no vitals filed for this visit.    Exam Gen:  NAD Abd: soft CV: RRR Lungs: CTA Rectal: palpable cord, post midline, no external opening visualized, non-inflamed skin tag     Labs, Imaging and Diagnostic Testing:     Assessment and Plan: Diagnoses and all orders for this visit:   Anal fistula vs Abscess     I have recommended EUA with possible I&D or fistulotomy.  We discussed if fistulotomy were performed, this could be difficult to heal due to the location at posterior midline.  We discussed that if the fistula traversed a large amount of the internal sphincter we would place a seton and perform  a separate operation to repair the fistula.  We discussed the risk of incontinence if too much sphincter complex is taken.  We discussed the risk of constipation after surgery worsening her incision.  We discussed the need for additional surgery if a seton is placed.  All questions were answered.  Discussed performing an MRI prior to the procedure but decided against this due to the most likely need of operative intervention anyway.   No follow-ups on file.     Vanita Panda, MD Colon and Rectal Surgery [facility]

## 2021-09-30 ENCOUNTER — Ambulatory Visit: Payer: Medicare Other | Admitting: Internal Medicine

## 2021-10-06 ENCOUNTER — Other Ambulatory Visit: Payer: Self-pay | Admitting: General Surgery

## 2021-10-06 DIAGNOSIS — K603 Anal fistula: Secondary | ICD-10-CM

## 2021-10-06 DIAGNOSIS — K610 Anal abscess: Secondary | ICD-10-CM

## 2021-10-08 ENCOUNTER — Ambulatory Visit: Payer: Medicare Other | Admitting: Internal Medicine

## 2021-10-16 DIAGNOSIS — I10 Essential (primary) hypertension: Secondary | ICD-10-CM | POA: Diagnosis not present

## 2021-10-16 DIAGNOSIS — G4700 Insomnia, unspecified: Secondary | ICD-10-CM | POA: Diagnosis not present

## 2021-10-16 DIAGNOSIS — M79671 Pain in right foot: Secondary | ICD-10-CM | POA: Diagnosis not present

## 2021-10-16 DIAGNOSIS — M21372 Foot drop, left foot: Secondary | ICD-10-CM | POA: Diagnosis not present

## 2021-10-21 ENCOUNTER — Encounter (HOSPITAL_COMMUNITY): Payer: Self-pay

## 2021-10-21 ENCOUNTER — Encounter: Payer: Self-pay | Admitting: Hematology and Oncology

## 2021-10-21 ENCOUNTER — Inpatient Hospital Stay: Payer: Medicare Other

## 2021-10-21 ENCOUNTER — Other Ambulatory Visit: Payer: Self-pay

## 2021-10-21 ENCOUNTER — Inpatient Hospital Stay: Payer: Medicare Other | Attending: Hematology and Oncology | Admitting: Hematology and Oncology

## 2021-10-21 ENCOUNTER — Ambulatory Visit (INDEPENDENT_AMBULATORY_CARE_PROVIDER_SITE_OTHER)
Admission: RE | Admit: 2021-10-21 | Discharge: 2021-10-21 | Disposition: A | Discharge status: Home / Self Care | Payer: Medicare Other | Source: Ambulatory Visit | Attending: Vascular Surgery | Admitting: Vascular Surgery

## 2021-10-21 ENCOUNTER — Other Ambulatory Visit (HOSPITAL_COMMUNITY): Payer: Self-pay | Admitting: Internal Medicine

## 2021-10-21 DIAGNOSIS — D649 Anemia, unspecified: Secondary | ICD-10-CM

## 2021-10-21 DIAGNOSIS — Z800 Family history of malignant neoplasm of digestive organs: Secondary | ICD-10-CM | POA: Insufficient documentation

## 2021-10-21 DIAGNOSIS — I739 Peripheral vascular disease, unspecified: Secondary | ICD-10-CM

## 2021-10-21 DIAGNOSIS — Z803 Family history of malignant neoplasm of breast: Secondary | ICD-10-CM | POA: Insufficient documentation

## 2021-10-21 LAB — COMPREHENSIVE METABOLIC PANEL
ALT: 12 U/L (ref 0–44)
AST: 18 U/L (ref 15–41)
Albumin: 4.6 g/dL (ref 3.5–5.0)
Alkaline Phosphatase: 69 U/L (ref 38–126)
Anion gap: 4 — ABNORMAL LOW (ref 5–15)
BUN: 19 mg/dL (ref 8–23)
CO2: 30 mmol/L (ref 22–32)
Calcium: 9.8 mg/dL (ref 8.9–10.3)
Chloride: 105 mmol/L (ref 98–111)
Creatinine, Ser: 0.66 mg/dL (ref 0.44–1.00)
GFR, Estimated: >60 mL/min (ref >60)
Glucose, Bld: 80 mg/dL (ref 70–99)
Potassium: 3.8 mmol/L (ref 3.5–5.1)
Sodium: 139 mmol/L (ref 135–145)
Total Bilirubin: 0.7 mg/dL (ref 0.3–1.2)
Total Protein: 7.1 g/dL (ref 6.5–8.1)

## 2021-10-21 LAB — CBC WITH DIFFERENTIAL/PLATELET
Abs Immature Granulocytes: 0 10*3/uL (ref 0.00–0.07)
Basophils Absolute: 0 10*3/uL (ref 0.0–0.1)
Basophils Relative: 1 %
Eosinophils Absolute: 0.2 10*3/uL (ref 0.0–0.5)
Eosinophils Relative: 5 %
HCT: 34.8 % — ABNORMAL LOW (ref 36.0–46.0)
Hemoglobin: 11.9 g/dL — ABNORMAL LOW (ref 12.0–15.0)
Immature Granulocytes: 0 %
Lymphocytes Relative: 41 %
Lymphs Abs: 1.6 10*3/uL (ref 0.7–4.0)
MCH: 29.5 pg (ref 26.0–34.0)
MCHC: 34.2 g/dL (ref 30.0–36.0)
MCV: 86.1 fL (ref 80.0–100.0)
Monocytes Absolute: 0.4 10*3/uL (ref 0.1–1.0)
Monocytes Relative: 10 %
Neutro Abs: 1.7 10*3/uL (ref 1.7–7.7)
Neutrophils Relative %: 43 %
Platelets: 131 10*3/uL — ABNORMAL LOW (ref 150–400)
RBC: 4.04 MIL/uL (ref 3.87–5.11)
RDW: 12.7 % (ref 11.5–15.5)
WBC: 3.9 10*3/uL — ABNORMAL LOW (ref 4.0–10.5)
nRBC: 0 % (ref 0.0–0.2)

## 2021-10-21 NOTE — Progress Notes (Signed)
[facility] NOTE  Patient Care Team: Donnajean Lopes, MD as PCP - General (Internal Medicine)  CHIEF COMPLAINTS/PURPOSE OF CONSULTATION:  Normocytic normochromic anemia.  ASSESSMENT & PLAN:  Normocytic normochromic anemia This is a very pleasant 66 year old female patient with past medical history is significant for hypertension referred to hematology for evaluation of mild normocytic normochromic anemia.  Upon review of labs for the past 4 years, her hemoglobin has almost always stayed around 11.5 g/dL Her anemia is normocytic normochromic, no evidence of iron deficiency ferritin at 151, no evidence of B12 deficiency. CMP completely normal hence anemia is unlikely related to anemia of chronic kidney disease. Serum electrophoresis normal, no evidence of M protein. I have ordered some additional labs including folic acid, hemolysis labs, TSH, ANA and she is here to review them. No evidence of hemolysis.  ANA negative.  No evidence of folic acid deficiency TSH appears to be normal.  At this time there is no obvious etiology for her chronic anemia. Since this is overall stable for the past 4 years, I do not believe we will have to proceed with an urgent bone marrow aspiration and biopsy.  I have however offered this to the patient.  She is comfortable with surveillance. We can continue surveillance at this time and she can return to clinic in 4 months. She also had several questions about possible decreased blood supply in the lower extremities contributing to her pain, role of metoprolol.  She was also worried to start gabapentin because of some side effects that she read on the label.   Age-appropriate cancer screening up-to-date. Orders Placed This Encounter  Procedures   CBC with Differential/Platelet    Standing Status:   Standing    Number of Occurrences:   22    Standing Expiration Date:   10/22/2022   Comprehensive metabolic panel    Standing Status:    Standing    Number of Occurrences:   33    Standing Expiration Date:   10/22/2022     HISTORY OF PRESENTING ILLNESS:  Kayla Guerra 66 y.o. female is here because of normocytic normochromic anemia.  This is a very pleasant 66 year old female patient with past medical history significant for hypertension, headaches referred to hematology because of ongoing fatigue and mild normocytic normochromic anemia.  [name] arrived to the appointment today with her husband.  She tells me that she has been feeling very tired but she did not realize that she was also having a urinary tract infection at the same time, also recently had a dental procedure and could not eat or drink much for about 4 weeks.  She also has a mentally disabled son who has autistic and behavioral problems and hence is very stressed most days and does not get much sleep.  She also was working about 80 hours a week and she retired 3 years earlier.  Mom had breast cancer at the age of 45, patient undergoes mammogram and MRI screening regularly, most recent screening without any evidence of malignancy.  She is otherwise up-to-date with age-appropriate cancer screening.     Interval History  Patient is here for follow-up by herself.  Since last visit, she has discontinued metoprolol she wonders if this has contributed to her lower extremity weakness and cramps.  She wonders if she has low blood pressure and her legs are a poor vascular supply and if metoprolol made it worse.  She is now following up with vascular surgery and  has some vascular indicis pending.  She otherwise tells me that she was prescribed gabapentin but she does not want to take it because she is worried about the side effects especially about osteoporosis that was mentioned in the label. She continues to have a lot of stress however this is tremendously better since she retired.  She has an autistic son who is 62 years old and she takes care of him. Her weight is  overall stable. Rest of the pertinent 10 point ROS reviewed and negative  MEDICAL HISTORY:  Past Medical History:  Diagnosis Date   Anxiety    BRCA gene mutation negative    Chronic constipation    GERD (gastroesophageal reflux disease)    Headache(784.0)    Hypertension    PONV (postoperative nausea and vomiting)    Wears contact lenses     SURGICAL HISTORY: Past Surgical History:  Procedure Laterality Date   BREAST EXCISIONAL BIOPSY Right 06-26-1999    dr Truitt Leep  '@[facility]'    CHOLECYSTECTOMY N/A 02/22/2018   Procedure: LAPAROSCOPIC CHOLECYSTECTOMY WITH INTRAOPERATIVE CHOLANGIOGRAM ERAS PATHWAY;  Surgeon: Johnathan Hausen, MD;  Location: [facility];  Service: General;  Laterality: N/A;   D & C HYSTEROSCOPY WITH RESECTION POLYPS  10-10-2009    dr rivard  '@[facility]'    DILATION AND CURETTAGE OF UTERUS  2006   WISDOM TOOTH EXTRACTION      SOCIAL HISTORY: Social History   Socioeconomic History   Marital status: Married    Spouse name: Not on file   Number of children: 1   Years of education: Not on file   Highest education level: Not on file  Occupational History   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  Tobacco Use   Smoking status: Never   Smokeless tobacco: Never  Vaping Use   Vaping Use: Never used  Substance and Sexual Activity   Alcohol use: Not Currently    Comment: occasional   Drug use: Never   Sexual activity: Yes    Birth control/protection: Surgical    Comment: vas  Other Topics Concern   Not on file  Social History Narrative   Right handed   Drinks caffeine   One story home   Social Determinants of Health   Financial Resource Strain: Not on file  Food Insecurity: Not on file  Transportation Needs: Not on file  Physical Activity: Not on file  Stress: Not on file  Social Connections: Not on file  Intimate Partner Violence: Not on file    FAMILY HISTORY: Family History  Problem Relation Age of Onset   Stroke Maternal Grandmother    Asthma Father    Heart  disease Mother    Hypertension Mother    Thyroid disease Mother    Breast cancer Mother 44   Heart disease Brother    ADD / ADHD Son    Other Son        mentally handicaped   Breast cancer Cousin 45   Colon cancer Cousin    Colon cancer Cousin     ALLERGIES:  is allergic to tetracycline, tetracyclines & related, penicillins, and erythromycin.  MEDICATIONS:  Current Outpatient Medications  Medication Sig Dispense Refill   ALPRAZolam (XANAX) 0.5 MG tablet Take 0.5 mg by mouth 2 (two) times daily as needed for anxiety or sleep.      Calcium-Phosphorus-Vitamin D (CITRACAL +D3 PO) Take by mouth.     gabapentin (NEURONTIN) 100 MG capsule Take 1 tablet at bedtime x 1 week, then 2 tablet at bedtime, x  3 tablets. 90 capsule 0   Metoprolol Succinate 100 MG CS24 Take 50 mg by mouth 2 (two) times daily. (Patient not taking: Reported on 09/19/2021)     NONFORMULARY OR COMPOUNDED ITEM Estradiol/vitamin e 0.02%/200IU/GM     NONFORMULARY OR COMPOUNDED ITEM Vitamin E vaginal suppositories 200u/ml.  One pv three times weekly. 36 each 3   Omega-3 Fatty Acids (OMEGA-3 2100 PO) Take by mouth.     Vitamin D, Ergocalciferol, (DRISDOL) 1.25 MG (50000 UNIT) CAPS capsule Take 50,000 Units by mouth every 7 (seven) days.     No current facility-administered medications for this visit.     PHYSICAL EXAMINATION: ECOG PERFORMANCE STATUS: 0 - Asymptomatic  Vitals:   10/21/21 0847  BP: 130/72  Pulse: 76  Resp: 18  Temp: 98.1 F (36.7 C)  SpO2: 100%    Filed Weights   10/21/21 0847  Weight: 101 lb 1.6 oz (45.9 kg)    Physical Exam Constitutional:      Appearance: Normal appearance.  Cardiovascular:     Rate and Rhythm: Normal rate and regular rhythm.  Abdominal:     General: Abdomen is flat. Bowel sounds are normal.     Palpations: Abdomen is soft.  Musculoskeletal:        General: No swelling. Normal range of motion.     Cervical back: Normal range of motion and neck supple. No rigidity.   Lymphadenopathy:     Cervical: No cervical adenopathy.  Skin:    General: Skin is warm and dry.  Neurological:     General: No focal deficit present.     Mental Status: She is alert.  Psychiatric:        Mood and Affect: Mood normal.     LABORATORY DATA:  I have reviewed the data as listed Lab Results  Component Value Date   WBC 4.7 06/17/2021   HGB 11.6 (L) 06/17/2021   HCT 35.5 (L) 06/17/2021   HCT 34.7 06/17/2021   MCV 89.0 06/17/2021   PLT 168 06/17/2021     Chemistry      Component Value Date/Time   NA 140 06/17/2021 1130   K 3.9 06/17/2021 1130   CL 107 06/17/2021 1130   CO2 30 06/17/2021 1130   BUN 13 06/17/2021 1130   CREATININE 0.63 06/17/2021 1130      Component Value Date/Time   CALCIUM 9.6 06/17/2021 1130   ALKPHOS 65 06/17/2021 1130   AST 19 06/17/2021 1130   ALT 14 06/17/2021 1130   BILITOT 0.5 06/17/2021 1130       RADIOGRAPHIC STUDIES: I have personally reviewed the radiological images as listed and agreed with the findings in the report. No results found.  All questions were answered. The patient knows to call the clinic with any problems, questions or concerns. I spent 30 minutes in the care of this patient including H and P, review of records, counseling and coordination of care.     Benay Pike, MD 10/21/2021 9:15 AM

## 2021-10-21 NOTE — Assessment & Plan Note (Addendum)
This is a very pleasant 66 year old female patient with past medical history is significant for hypertension referred to hematology for evaluation of mild normocytic normochromic anemia.  Upon review of labs for the past 4 years, her hemoglobin has almost always stayed around 11.5 g/dL Her anemia is normocytic normochromic, no evidence of iron deficiency ferritin at 151, no evidence of B12 deficiency. CMP completely normal hence anemia is unlikely related to anemia of chronic kidney disease. Serum electrophoresis normal, no evidence of M protein. I have ordered some additional labs including folic acid, hemolysis labs, TSH, ANA and she is here to review them. No evidence of hemolysis.  ANA negative.  No evidence of folic acid deficiency TSH appears to be normal.  At this time there is no obvious etiology for her chronic anemia. Since this is overall stable for the past 4 years, I do not believe we will have to proceed with an urgent bone marrow aspiration and biopsy.  I have however offered this to the patient.  She is comfortable with surveillance. We can continue surveillance at this time and she can return to clinic in 4 months. She also had several questions about possible decreased blood supply in the lower extremities contributing to her pain, role of metoprolol.  She was also worried to start gabapentin because of some side effects that she read on the label.

## 2021-10-23 ENCOUNTER — Ambulatory Visit
Admission: RE | Admit: 2021-10-23 | Discharge: 2021-10-23 | Disposition: A | Discharge status: Home / Self Care | Payer: Medicare Other | Source: Ambulatory Visit | Attending: General Surgery | Admitting: General Surgery

## 2021-10-23 DIAGNOSIS — K610 Anal abscess: Secondary | ICD-10-CM

## 2021-10-23 DIAGNOSIS — K603 Anal fistula: Secondary | ICD-10-CM

## 2021-10-23 MED ORDER — GADOPICLENOL 0.5 MMOL/ML IV SOLN
5.0000 mL | Freq: Once | INTRAVENOUS | Status: AC | PRN
  Administered 2021-10-23: 5 mL via INTRAVENOUS

## 2021-10-24 ENCOUNTER — Other Ambulatory Visit: Payer: Self-pay | Admitting: Internal Medicine

## 2021-10-24 ENCOUNTER — Ambulatory Visit: Payer: Medicare Other | Admitting: Neurology

## 2021-10-24 DIAGNOSIS — M79672 Pain in left foot: Secondary | ICD-10-CM

## 2021-10-24 DIAGNOSIS — M21372 Foot drop, left foot: Secondary | ICD-10-CM

## 2021-10-24 DIAGNOSIS — K603 Anal fistula: Secondary | ICD-10-CM | POA: Diagnosis not present

## 2021-10-29 ENCOUNTER — Other Ambulatory Visit: Payer: Self-pay | Admitting: *Deleted

## 2021-10-29 DIAGNOSIS — D649 Anemia, unspecified: Secondary | ICD-10-CM

## 2021-10-29 DIAGNOSIS — D696 Thrombocytopenia, unspecified: Secondary | ICD-10-CM

## 2021-10-31 ENCOUNTER — Ambulatory Visit: Payer: Medicare Other | Admitting: Neurology

## 2021-11-11 ENCOUNTER — Ambulatory Visit
Admission: RE | Admit: 2021-11-11 | Discharge: 2021-11-11 | Disposition: A | Discharge status: Home / Self Care | Payer: Medicare Other | Source: Ambulatory Visit | Attending: Internal Medicine | Admitting: Internal Medicine

## 2021-11-11 DIAGNOSIS — M79672 Pain in left foot: Secondary | ICD-10-CM

## 2021-11-11 DIAGNOSIS — R200 Anesthesia of skin: Secondary | ICD-10-CM | POA: Diagnosis not present

## 2021-11-11 DIAGNOSIS — M48061 Spinal stenosis, lumbar region without neurogenic claudication: Secondary | ICD-10-CM | POA: Diagnosis not present

## 2021-11-11 DIAGNOSIS — M4807 Spinal stenosis, lumbosacral region: Secondary | ICD-10-CM | POA: Diagnosis not present

## 2021-11-11 DIAGNOSIS — M21372 Foot drop, left foot: Secondary | ICD-10-CM

## 2021-11-13 ENCOUNTER — Other Ambulatory Visit: Payer: Medicare Other

## 2021-11-16 HISTORY — PX: ANAL FISTULECTOMY: SHX1139

## 2021-11-17 DIAGNOSIS — K219 Gastro-esophageal reflux disease without esophagitis: Secondary | ICD-10-CM | POA: Diagnosis not present

## 2021-11-17 DIAGNOSIS — Z681 Body mass index (BMI) 19 or less, adult: Secondary | ICD-10-CM | POA: Diagnosis not present

## 2021-11-17 DIAGNOSIS — K603 Anal fistula: Secondary | ICD-10-CM | POA: Diagnosis not present

## 2021-11-25 ENCOUNTER — Other Ambulatory Visit: Payer: Self-pay

## 2021-11-25 ENCOUNTER — Inpatient Hospital Stay: Payer: Medicare Other | Attending: Hematology and Oncology

## 2021-11-25 DIAGNOSIS — D649 Anemia, unspecified: Secondary | ICD-10-CM | POA: Insufficient documentation

## 2021-11-25 DIAGNOSIS — D696 Thrombocytopenia, unspecified: Secondary | ICD-10-CM

## 2021-11-25 DIAGNOSIS — N390 Urinary tract infection, site not specified: Secondary | ICD-10-CM | POA: Diagnosis not present

## 2021-11-25 LAB — CBC WITH DIFFERENTIAL (CANCER CENTER ONLY)
Abs Immature Granulocytes: 0 10*3/uL (ref 0.00–0.07)
Basophils Absolute: 0 10*3/uL (ref 0.0–0.1)
Basophils Relative: 1 %
Eosinophils Absolute: 0.3 10*3/uL (ref 0.0–0.5)
Eosinophils Relative: 6 %
HCT: 36.3 % (ref 36.0–46.0)
Hemoglobin: 12.3 g/dL (ref 12.0–15.0)
Immature Granulocytes: 0 %
Lymphocytes Relative: 41 %
Lymphs Abs: 1.6 10*3/uL (ref 0.7–4.0)
MCH: 29.9 pg (ref 26.0–34.0)
MCHC: 33.9 g/dL (ref 30.0–36.0)
MCV: 88.1 fL (ref 80.0–100.0)
Monocytes Absolute: 0.4 10*3/uL (ref 0.1–1.0)
Monocytes Relative: 10 %
Neutro Abs: 1.7 10*3/uL (ref 1.7–7.7)
Neutrophils Relative %: 42 %
Platelet Count: 144 10*3/uL — ABNORMAL LOW (ref 150–400)
RBC: 4.12 MIL/uL (ref 3.87–5.11)
RDW: 12.7 % (ref 11.5–15.5)
WBC Count: 3.9 10*3/uL — ABNORMAL LOW (ref 4.0–10.5)
nRBC: 0 % (ref 0.0–0.2)

## 2021-11-25 LAB — CMP (CANCER CENTER ONLY)
ALT: 14 U/L (ref 0–44)
AST: 19 U/L (ref 15–41)
Albumin: 4.4 g/dL (ref 3.5–5.0)
Alkaline Phosphatase: 66 U/L (ref 38–126)
Anion gap: 4 — ABNORMAL LOW (ref 5–15)
BUN: 14 mg/dL (ref 8–23)
CO2: 31 mmol/L (ref 22–32)
Calcium: 9.4 mg/dL (ref 8.9–10.3)
Chloride: 106 mmol/L (ref 98–111)
Creatinine: 0.63 mg/dL (ref 0.44–1.00)
GFR, Estimated: >60 mL/min (ref >60)
Glucose, Bld: 68 mg/dL — ABNORMAL LOW (ref 70–99)
Potassium: 4 mmol/L (ref 3.5–5.1)
Sodium: 141 mmol/L (ref 135–145)
Total Bilirubin: 0.5 mg/dL (ref 0.3–1.2)
Total Protein: 7.1 g/dL (ref 6.5–8.1)

## 2021-12-02 DIAGNOSIS — R3129 Other microscopic hematuria: Secondary | ICD-10-CM | POA: Diagnosis not present

## 2021-12-02 DIAGNOSIS — Z87440 Personal history of urinary (tract) infections: Secondary | ICD-10-CM | POA: Diagnosis not present

## 2021-12-04 DIAGNOSIS — Z87440 Personal history of urinary (tract) infections: Secondary | ICD-10-CM | POA: Diagnosis not present

## 2021-12-04 DIAGNOSIS — Z792 Long term (current) use of antibiotics: Secondary | ICD-10-CM | POA: Diagnosis not present

## 2021-12-04 DIAGNOSIS — K630 Abscess of intestine: Secondary | ICD-10-CM | POA: Diagnosis not present

## 2021-12-04 DIAGNOSIS — I10 Essential (primary) hypertension: Secondary | ICD-10-CM | POA: Diagnosis not present

## 2021-12-04 DIAGNOSIS — Z880 Allergy status to penicillin: Secondary | ICD-10-CM | POA: Diagnosis not present

## 2021-12-04 DIAGNOSIS — K603 Anal fistula: Secondary | ICD-10-CM | POA: Diagnosis not present

## 2021-12-04 DIAGNOSIS — N952 Postmenopausal atrophic vaginitis: Secondary | ICD-10-CM | POA: Diagnosis not present

## 2021-12-19 DIAGNOSIS — K5900 Constipation, unspecified: Secondary | ICD-10-CM | POA: Diagnosis not present

## 2021-12-19 DIAGNOSIS — Z09 Encounter for follow-up examination after completed treatment for conditions other than malignant neoplasm: Secondary | ICD-10-CM | POA: Diagnosis not present

## 2021-12-19 DIAGNOSIS — K219 Gastro-esophageal reflux disease without esophagitis: Secondary | ICD-10-CM | POA: Diagnosis not present

## 2022-01-16 DIAGNOSIS — K603 Anal fistula: Secondary | ICD-10-CM | POA: Diagnosis not present

## 2022-02-02 DIAGNOSIS — Z87440 Personal history of urinary (tract) infections: Secondary | ICD-10-CM | POA: Diagnosis not present

## 2022-02-02 DIAGNOSIS — N952 Postmenopausal atrophic vaginitis: Secondary | ICD-10-CM | POA: Diagnosis not present

## 2022-02-20 ENCOUNTER — Ambulatory Visit: Payer: Medicare Other | Admitting: Hematology and Oncology

## 2022-02-20 ENCOUNTER — Other Ambulatory Visit: Payer: Medicare Other

## 2022-03-03 DIAGNOSIS — Z23 Encounter for immunization: Secondary | ICD-10-CM | POA: Diagnosis not present

## 2022-03-03 DIAGNOSIS — M792 Neuralgia and neuritis, unspecified: Secondary | ICD-10-CM | POA: Diagnosis not present

## 2022-03-18 DIAGNOSIS — M5416 Radiculopathy, lumbar region: Secondary | ICD-10-CM | POA: Diagnosis not present

## 2022-03-18 DIAGNOSIS — M48061 Spinal stenosis, lumbar region without neurogenic claudication: Secondary | ICD-10-CM | POA: Diagnosis not present

## 2022-03-31 ENCOUNTER — Telehealth: Payer: Self-pay | Admitting: Hematology and Oncology

## 2022-03-31 NOTE — Telephone Encounter (Signed)
Per 2/13 IB rescheduled pt; Pt currently testing + for covid, rescheduled pt pt is aware and confirmed.

## 2022-04-03 ENCOUNTER — Other Ambulatory Visit: Payer: Medicare Other

## 2022-04-03 ENCOUNTER — Ambulatory Visit: Payer: Medicare Other | Admitting: Hematology and Oncology

## 2022-04-20 ENCOUNTER — Other Ambulatory Visit: Payer: Self-pay | Admitting: *Deleted

## 2022-04-20 ENCOUNTER — Inpatient Hospital Stay: Payer: Medicare Other | Admitting: Hematology and Oncology

## 2022-04-20 ENCOUNTER — Inpatient Hospital Stay: Payer: Medicare Other | Attending: Hematology and Oncology

## 2022-04-20 ENCOUNTER — Encounter: Payer: Self-pay | Admitting: Hematology and Oncology

## 2022-04-20 ENCOUNTER — Other Ambulatory Visit: Payer: Self-pay

## 2022-04-20 VITALS — BP 129/79 | HR 77 | Temp 97.9°F | Resp 16 | Ht 62.0 in | Wt 102.4 lb

## 2022-04-20 DIAGNOSIS — Z9049 Acquired absence of other specified parts of digestive tract: Secondary | ICD-10-CM

## 2022-04-20 DIAGNOSIS — Z803 Family history of malignant neoplasm of breast: Secondary | ICD-10-CM | POA: Diagnosis not present

## 2022-04-20 DIAGNOSIS — Z800 Family history of malignant neoplasm of digestive organs: Secondary | ICD-10-CM | POA: Diagnosis not present

## 2022-04-20 DIAGNOSIS — D696 Thrombocytopenia, unspecified: Secondary | ICD-10-CM | POA: Diagnosis not present

## 2022-04-20 DIAGNOSIS — D649 Anemia, unspecified: Secondary | ICD-10-CM | POA: Insufficient documentation

## 2022-04-20 DIAGNOSIS — I10 Essential (primary) hypertension: Secondary | ICD-10-CM | POA: Diagnosis not present

## 2022-04-20 LAB — CBC WITH DIFFERENTIAL/PLATELET
Abs Immature Granulocytes: 0.01 10*3/uL (ref 0.00–0.07)
Basophils Absolute: 0 10*3/uL (ref 0.0–0.1)
Basophils Relative: 0 %
Eosinophils Absolute: 0.2 10*3/uL (ref 0.0–0.5)
Eosinophils Relative: 4 %
HCT: 35.6 % — ABNORMAL LOW (ref 36.0–46.0)
Hemoglobin: 12.2 g/dL (ref 12.0–15.0)
Immature Granulocytes: 0 %
Lymphocytes Relative: 36 %
Lymphs Abs: 1.7 10*3/uL (ref 0.7–4.0)
MCH: 30.2 pg (ref 26.0–34.0)
MCHC: 34.3 g/dL (ref 30.0–36.0)
MCV: 88.1 fL (ref 80.0–100.0)
Monocytes Absolute: 0.4 10*3/uL (ref 0.1–1.0)
Monocytes Relative: 8 %
Neutro Abs: 2.5 10*3/uL (ref 1.7–7.7)
Neutrophils Relative %: 52 %
Platelets: 154 10*3/uL (ref 150–400)
RBC: 4.04 MIL/uL (ref 3.87–5.11)
RDW: 12.9 % (ref 11.5–15.5)
WBC: 4.8 10*3/uL (ref 4.0–10.5)
nRBC: 0 % (ref 0.0–0.2)

## 2022-04-20 LAB — COMPREHENSIVE METABOLIC PANEL
ALT: 15 U/L (ref 0–44)
AST: 19 U/L (ref 15–41)
Albumin: 4.5 g/dL (ref 3.5–5.0)
Alkaline Phosphatase: 88 U/L (ref 38–126)
Anion gap: 6 (ref 5–15)
BUN: 13 mg/dL (ref 8–23)
CO2: 28 mmol/L (ref 22–32)
Calcium: 9.4 mg/dL (ref 8.9–10.3)
Chloride: 104 mmol/L (ref 98–111)
Creatinine, Ser: 0.59 mg/dL (ref 0.44–1.00)
GFR, Estimated: >60 mL/min (ref >60)
Glucose, Bld: 81 mg/dL (ref 70–99)
Potassium: 3.6 mmol/L (ref 3.5–5.1)
Sodium: 138 mmol/L (ref 135–145)
Total Bilirubin: 0.7 mg/dL (ref 0.3–1.2)
Total Protein: 7.1 g/dL (ref 6.5–8.1)

## 2022-04-20 LAB — VITAMIN B12: Vitamin B-12: 712 pg/mL (ref 180–914)

## 2022-04-20 LAB — TSH: TSH: 1.558 u[IU]/mL (ref 0.350–4.500)

## 2022-04-20 NOTE — Progress Notes (Signed)
[facility] NOTE  Patient Care Team: Donnajean Lopes, MD as PCP - General (Internal Medicine) Donnajean Lopes, MD (Internal Medicine)  CHIEF COMPLAINTS/PURPOSE OF CONSULTATION:  Normocytic normochromic anemia.  ASSESSMENT & PLAN:   This is a very pleasant 67 year old female patient with past medical history is significant for hypertension referred to hematology for evaluation of mild normocytic normochromic anemia.  There is no evidence of nutritional deficiency, iron panel, B12 normal. Serum electrophoresis normal, no evidence of M protein. No evidence of hemolysis, autoimmune disease, TSH normal, folic acid levels are normal No evidence of hemolysis.  We have recommended surveillance since this is overall stable.  On review of labs today, her anemia has resolved.  I do not believe her current symptoms are related to anemia.  I encouraged her to contact with her PCP to discuss about the scant hemoptysis.  She expressed understanding.  She can return to clinic in 1 year or sooner as needed.  All questions were answered to the best my knowledge.   HISTORY OF PRESENTING ILLNESS:  Kayla Guerra 67 y.o. female is here because of normocytic normochromic anemia.  This is a very pleasant 67 year old female patient with past medical history significant for hypertension, headaches referred to hematology because of ongoing fatigue and mild normocytic normochromic anemia.    Interval History  Patient is here for follow-up by herself.   Since her last visit here, she continues to report some ongoing fatigue.  She apparently had noticed some scant blood in her sputum when she has this episodic cough and she wondered if this could be normal.  She has also noticed some nasal drainage.  She mentions that the blood is not from the postnasal drip.  She has been eating more calories since they are remodeling their kitchen and she had to do a lot of takeout.  Other than that  she denies anything new with her health.  Rest of the pertinent 10 point ROS reviewed and negative  MEDICAL HISTORY:  Past Medical History:  Diagnosis Date   Anxiety    BRCA gene mutation negative    Chronic constipation    GERD (gastroesophageal reflux disease)    Headache(784.0)    Hypertension    PONV (postoperative nausea and vomiting)    Wears contact lenses     SURGICAL HISTORY: Past Surgical History:  Procedure Laterality Date   BREAST EXCISIONAL BIOPSY Right 06-26-1999    dr Truitt Leep  '@[facility]    CHOLECYSTECTOMY N/A 02/22/2018   Procedure: LAPAROSCOPIC CHOLECYSTECTOMY WITH INTRAOPERATIVE CHOLANGIOGRAM ERAS PATHWAY;  Surgeon: Johnathan Hausen, MD;  Location: [facility];  Service: General;  Laterality: N/A;   D & C HYSTEROSCOPY WITH RESECTION POLYPS  10-10-2009    dr rivard  '@[facility]'$    DILATION AND CURETTAGE OF UTERUS  2006   WISDOM TOOTH EXTRACTION      SOCIAL HISTORY: Social History   Socioeconomic History   Marital status: Married    Spouse name: Not on file   Number of children: 1   Years of education: Not on file   Highest education level: Not on file  Occupational History   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  Tobacco Use   Smoking status: Never   Smokeless tobacco: Never  Vaping Use   Vaping Use: Never used  Substance and Sexual Activity   Alcohol use: Not Currently    Comment: occasional   Drug use: Never   Sexual activity: Yes    Birth control/protection: Surgical  Comment: vas  Other Topics Concern   Not on file  Social History Narrative   Right handed   Drinks caffeine   One story home   Social Determinants of Health   Financial Resource Strain: Not on file  Food Insecurity: Not on file  Transportation Needs: Not on file  Physical Activity: Not on file  Stress: Not on file  Social Connections: Not on file  Intimate Partner Violence: Not on file    FAMILY HISTORY: Family History  Problem Relation Age of Onset   Stroke Maternal Grandmother     Asthma Father    Heart disease Mother    Hypertension Mother    Thyroid disease Mother    Breast cancer Mother 35   Heart disease Brother    ADD / ADHD Son    Other Son        mentally handicaped   Breast cancer Cousin 40   Colon cancer Cousin    Colon cancer Cousin     ALLERGIES:  is allergic to tetracycline, tetracyclines & related, penicillins, and erythromycin.  MEDICATIONS:  Current Outpatient Medications  Medication Sig Dispense Refill   ALPRAZolam (XANAX) 0.5 MG tablet Take 0.5 mg by mouth 2 (two) times daily as needed for anxiety or sleep.      Calcium-Phosphorus-Vitamin D (CITRACAL +D3 PO) Take by mouth.     gabapentin (NEURONTIN) 100 MG capsule Take 1 tablet at bedtime x 1 week, then 2 tablet at bedtime, x 3 tablets. 90 capsule 0   NONFORMULARY OR COMPOUNDED ITEM Estradiol/vitamin e 0.02%/200IU/GM     NONFORMULARY OR COMPOUNDED ITEM Vitamin E vaginal suppositories 200u/ml.  One pv three times weekly. 36 each 3   Omega-3 Fatty Acids (OMEGA-3 2100 PO) Take by mouth.     Vitamin D, Ergocalciferol, (DRISDOL) 1.25 MG (50000 UNIT) CAPS capsule Take 50,000 Units by mouth every 7 (seven) days.     No current facility-administered medications for this visit.     PHYSICAL EXAMINATION: ECOG PERFORMANCE STATUS: 0 - Asymptomatic  Vitals:   04/20/22 1117  BP: 129/79  Pulse: 77  Resp: 16  Temp: 97.9 F (36.6 C)  SpO2: 97%    Filed Weights   04/20/22 1117  Weight: 102 lb 6.4 oz (46.4 kg)    Physical Exam Constitutional:      Appearance: Normal appearance.  Cardiovascular:     Rate and Rhythm: Normal rate and regular rhythm.  Abdominal:     General: Abdomen is flat. Bowel sounds are normal.     Palpations: Abdomen is soft.  Musculoskeletal:        General: No swelling. Normal range of motion.     Cervical back: Normal range of motion and neck supple. No rigidity.  Lymphadenopathy:     Cervical: No cervical adenopathy.  Skin:    General: Skin is warm and dry.   Neurological:     General: No focal deficit present.     Mental Status: She is alert.  Psychiatric:        Mood and Affect: Mood normal.     LABORATORY DATA:  I have reviewed the data as listed Lab Results  Component Value Date   WBC 4.8 04/20/2022   HGB 12.2 04/20/2022   HCT 35.6 (L) 04/20/2022   MCV 88.1 04/20/2022   PLT 154 04/20/2022     Chemistry      Component Value Date/Time   NA 138 04/20/2022 1039   K 3.6 04/20/2022 1039   CL 104  04/20/2022 1039   CO2 28 04/20/2022 1039   BUN 13 04/20/2022 1039   CREATININE 0.59 04/20/2022 1039   CREATININE 0.63 11/25/2021 1037      Component Value Date/Time   CALCIUM 9.4 04/20/2022 1039   ALKPHOS 88 04/20/2022 1039   AST 19 04/20/2022 1039   AST 19 11/25/2021 1037   ALT 15 04/20/2022 1039   ALT 14 11/25/2021 1037   BILITOT 0.7 04/20/2022 1039   BILITOT 0.5 11/25/2021 1037     CBC from today showed normal white blood cell count, hemoglobin as well as platelet count. CMP showed normal creatinine, no evidence of transaminitis TSH is normal B12 is normal  RADIOGRAPHIC STUDIES: I have personally reviewed the radiological images as listed and agreed with the findings in the report. No results found.  All questions were answered. The patient knows to call the clinic with any problems, questions or concerns. I spent 30 minutes in the care of this patient including H and P, review of records, counseling and coordination of care.     Benay Pike, MD 04/20/2022 4:03 PM

## 2022-04-21 DIAGNOSIS — R051 Acute cough: Secondary | ICD-10-CM | POA: Diagnosis not present

## 2022-04-21 DIAGNOSIS — R093 Abnormal sputum: Secondary | ICD-10-CM | POA: Diagnosis not present

## 2022-04-30 DIAGNOSIS — L814 Other melanin hyperpigmentation: Secondary | ICD-10-CM | POA: Diagnosis not present

## 2022-04-30 DIAGNOSIS — L718 Other rosacea: Secondary | ICD-10-CM | POA: Diagnosis not present

## 2022-04-30 DIAGNOSIS — L218 Other seborrheic dermatitis: Secondary | ICD-10-CM | POA: Diagnosis not present

## 2022-04-30 DIAGNOSIS — D225 Melanocytic nevi of trunk: Secondary | ICD-10-CM | POA: Diagnosis not present

## 2022-05-01 ENCOUNTER — Other Ambulatory Visit (HOSPITAL_BASED_OUTPATIENT_CLINIC_OR_DEPARTMENT_OTHER): Payer: Self-pay | Admitting: Obstetrics & Gynecology

## 2022-05-01 DIAGNOSIS — Z9189 Other specified personal risk factors, not elsewhere classified: Secondary | ICD-10-CM

## 2022-05-05 DIAGNOSIS — M5137 Other intervertebral disc degeneration, lumbosacral region: Secondary | ICD-10-CM | POA: Diagnosis not present

## 2022-05-05 DIAGNOSIS — M5416 Radiculopathy, lumbar region: Secondary | ICD-10-CM | POA: Diagnosis not present

## 2022-05-05 DIAGNOSIS — M48061 Spinal stenosis, lumbar region without neurogenic claudication: Secondary | ICD-10-CM | POA: Diagnosis not present

## 2022-05-05 DIAGNOSIS — M5136 Other intervertebral disc degeneration, lumbar region: Secondary | ICD-10-CM | POA: Diagnosis not present

## 2022-05-05 DIAGNOSIS — M4317 Spondylolisthesis, lumbosacral region: Secondary | ICD-10-CM | POA: Diagnosis not present

## 2022-05-05 DIAGNOSIS — M47816 Spondylosis without myelopathy or radiculopathy, lumbar region: Secondary | ICD-10-CM | POA: Diagnosis not present

## 2022-05-12 ENCOUNTER — Encounter (HOSPITAL_BASED_OUTPATIENT_CLINIC_OR_DEPARTMENT_OTHER): Payer: Self-pay | Admitting: Obstetrics & Gynecology

## 2022-05-18 DIAGNOSIS — K5900 Constipation, unspecified: Secondary | ICD-10-CM | POA: Diagnosis not present

## 2022-05-18 DIAGNOSIS — K219 Gastro-esophageal reflux disease without esophagitis: Secondary | ICD-10-CM | POA: Diagnosis not present

## 2022-05-20 ENCOUNTER — Other Ambulatory Visit (HOSPITAL_BASED_OUTPATIENT_CLINIC_OR_DEPARTMENT_OTHER): Payer: Self-pay | Admitting: Obstetrics & Gynecology

## 2022-05-20 DIAGNOSIS — Z9189 Other specified personal risk factors, not elsewhere classified: Secondary | ICD-10-CM

## 2022-05-20 DIAGNOSIS — M4800 Spinal stenosis, site unspecified: Secondary | ICD-10-CM | POA: Diagnosis not present

## 2022-05-21 ENCOUNTER — Other Ambulatory Visit: Payer: Self-pay | Admitting: Obstetrics & Gynecology

## 2022-05-21 ENCOUNTER — Other Ambulatory Visit: Payer: Medicare Other

## 2022-05-21 DIAGNOSIS — Z1231 Encounter for screening mammogram for malignant neoplasm of breast: Secondary | ICD-10-CM

## 2022-05-25 ENCOUNTER — Ambulatory Visit
Admission: RE | Admit: 2022-05-25 | Discharge: 2022-05-25 | Disposition: A | Discharge status: Home / Self Care | Payer: Medicare Other | Source: Ambulatory Visit | Attending: Obstetrics & Gynecology | Admitting: Obstetrics & Gynecology

## 2022-05-25 DIAGNOSIS — Z1231 Encounter for screening mammogram for malignant neoplasm of breast: Secondary | ICD-10-CM

## 2022-05-27 DIAGNOSIS — K219 Gastro-esophageal reflux disease without esophagitis: Secondary | ICD-10-CM | POA: Diagnosis not present

## 2022-05-27 DIAGNOSIS — K209 Esophagitis, unspecified without bleeding: Secondary | ICD-10-CM | POA: Diagnosis not present

## 2022-05-27 DIAGNOSIS — K2950 Unspecified chronic gastritis without bleeding: Secondary | ICD-10-CM | POA: Diagnosis not present

## 2022-05-27 DIAGNOSIS — K2960 Other gastritis without bleeding: Secondary | ICD-10-CM | POA: Diagnosis not present

## 2022-06-01 ENCOUNTER — Ambulatory Visit (HOSPITAL_BASED_OUTPATIENT_CLINIC_OR_DEPARTMENT_OTHER): Payer: Medicare Other | Admitting: Obstetrics & Gynecology

## 2022-06-03 DIAGNOSIS — K080 Exfoliation of teeth due to systemic causes: Secondary | ICD-10-CM | POA: Diagnosis not present

## 2022-06-08 ENCOUNTER — Other Ambulatory Visit (HOSPITAL_COMMUNITY)
Admission: RE | Admit: 2022-06-08 | Discharge: 2022-06-08 | Disposition: A | Discharge status: Home / Self Care | Payer: Medicare Other | Source: Ambulatory Visit | Attending: Obstetrics & Gynecology | Admitting: Obstetrics & Gynecology

## 2022-06-08 ENCOUNTER — Encounter (HOSPITAL_BASED_OUTPATIENT_CLINIC_OR_DEPARTMENT_OTHER): Payer: Self-pay | Admitting: Obstetrics & Gynecology

## 2022-06-08 ENCOUNTER — Ambulatory Visit (INDEPENDENT_AMBULATORY_CARE_PROVIDER_SITE_OTHER): Payer: Medicare Other | Admitting: Obstetrics & Gynecology

## 2022-06-08 VITALS — BP 127/83 | HR 77 | Ht 62.5 in | Wt 104.0 lb

## 2022-06-08 DIAGNOSIS — Z124 Encounter for screening for malignant neoplasm of cervix: Secondary | ICD-10-CM | POA: Diagnosis not present

## 2022-06-08 DIAGNOSIS — Z803 Family history of malignant neoplasm of breast: Secondary | ICD-10-CM | POA: Diagnosis not present

## 2022-06-08 DIAGNOSIS — Z01419 Encounter for gynecological examination (general) (routine) without abnormal findings: Secondary | ICD-10-CM | POA: Diagnosis not present

## 2022-06-08 DIAGNOSIS — M810 Age-related osteoporosis without current pathological fracture: Secondary | ICD-10-CM

## 2022-06-08 DIAGNOSIS — I10 Essential (primary) hypertension: Secondary | ICD-10-CM

## 2022-06-08 DIAGNOSIS — Z87440 Personal history of urinary (tract) infections: Secondary | ICD-10-CM

## 2022-06-08 NOTE — Progress Notes (Unsigned)
67 y.o. G64P0101 Married White or Caucasian female here for breast and pelvic exam.  Has strong family hx of breast cancer.  Tyrer Cusick model done last year.  Had only intermediate risk probably due to fact her mother lived to age 29.  Screening breast MRI was ordered.  This was changed by breast center but then pt notified the day prior.  Pt also needed mammogram.  MRI cancelled and mammogram completed.  Pt still desires MRI.  Ordered today.  Will try to precert.   Tyrer Cusick model done again today as pt does not remember doing this last year.  Results were similar with risk about 12%.  I also added a maternal aunt with hx of breast cancer that was not in the prior testing.  She has had genetic testing done years ago.  Has been unable to get me the results.  We discussed repeating this again as if this were positive, it would change her management options.  Referral placed.    Last year at exam, likely perianal abscess seen.  MRI obtained.  Was referred to general surgery.  Pt ended up going to [facility] and having procedure done.  Reviewed in care Everywhere.  Operative note procedure was surgical treatment of anal fistula.  Other issues this past year: bile reflux with endoscopy done 4/10 at [facility].  Reviewed in Care Everywhere.  H/o recurrent UTIs and followed by urology at [facility].  Having spinal stenosis issues and followed by NSG at [facility] as well.  Denies vaginal bleeding.    Patient's last menstrual period was 07/24/2010.          Sexually active: Yes.    The current method of family planning is post menopausal status.    Smoker:  no  Health Maintenance: Pap:  2023 History of abnormal Pap:  no MMG:  05/26/2022 Colonoscopy:  2018, follow up 10 year BMD:   Dr. Jarold Motto is planning this in June Screening Labs: will do this summer   reports that she has never smoked. She has never used smokeless tobacco. She reports that she does not currently use alcohol. She reports that she does not use  drugs.  Past Medical History:  Diagnosis Date   Anxiety    BRCA gene mutation negative    Chronic constipation    GERD (gastroesophageal reflux disease)    Headache(784.0)    Hypertension    PONV (postoperative nausea and vomiting)    Wears contact lenses     Past Surgical History:  Procedure Laterality Date   BREAST EXCISIONAL BIOPSY Right 06-26-1999    dr Luan Pulling     CHOLECYSTECTOMY N/A 02/22/2018   Procedure: LAPAROSCOPIC CHOLECYSTECTOMY WITH INTRAOPERATIVE CHOLANGIOGRAM ERAS PATHWAY;  Surgeon: Luretha Murphy, MD;  Location: [facility];  Service: General;  Laterality: N/A;   D & C HYSTEROSCOPY WITH RESECTION POLYPS  10-10-2009    dr rivard     DILATION AND CURETTAGE OF UTERUS  2006   WISDOM TOOTH EXTRACTION      Current Outpatient Medications  Medication Sig Dispense Refill   ALPRAZolam (XANAX) 0.5 MG tablet Take 0.5 mg by mouth 2 (two) times daily as needed for anxiety or sleep.      Calcium-Phosphorus-Vitamin D (CITRACAL +D3 PO) Take by mouth.     NONFORMULARY OR COMPOUNDED ITEM Estradiol/vitamin e 0.02%/200IU/GM     NONFORMULARY OR COMPOUNDED ITEM Vitamin E vaginal suppositories 200u/ml.  One pv three times weekly. 36 each 3   Omega-3 Fatty Acids (OMEGA-3 2100 PO) Take  by mouth.     Vitamin D, Ergocalciferol, (DRISDOL) 1.25 MG (50000 UNIT) CAPS capsule Take 50,000 Units by mouth every 7 (seven) days.     No current facility-administered medications for this visit.    Family History  Problem Relation Age of Onset   Stroke Maternal Grandmother    Asthma Father    Heart disease Mother    Hypertension Mother    Thyroid disease Mother    Breast cancer Mother 1   Heart disease Brother    ADD / ADHD Son    Other Son        mentally handicaped   Breast cancer Cousin 30   Colon cancer Cousin    Colon cancer Cousin     ROS: Constitutional: negative Genitourinary:negative  Exam:   BP 127/83 (BP Location: Left Arm, Patient Position: Sitting, Cuff Size:  Normal)   Pulse 77   Ht 5' 2.5" (1.588 m) Comment: Reported  Wt 104 lb (47.2 kg)   LMP 07/24/2010   BMI 18.72 kg/m   Height: 5' 2.5" (158.8 cm) (Reported)  General appearance: alert, cooperative and appears stated age Head: Normocephalic, without obvious abnormality, atraumatic Neck: no adenopathy, supple, symmetrical, trachea midline and thyroid normal to inspection and palpation Lungs: clear to auscultation bilaterally Breasts: normal appearance, no masses or tenderness Heart: regular rate and rhythm Abdomen: soft, non-tender; bowel sounds normal; no masses,  no organomegaly Extremities: extremities normal, atraumatic, no cyanosis or edema Skin: Skin color, texture, turgor normal. No rashes or lesions Lymph nodes: Cervical, supraclavicular, and axillary nodes normal. No abnormal inguinal nodes palpated Neurologic: Grossly normal   Pelvic: External genitalia:  no lesions              Urethra:  normal appearing urethra with no masses, tenderness or lesions              Bartholins and Skenes: normal                 Vagina: normal appearing vagina with normal color and no discharge, no lesions              Cervix: no lesions              Pap taken: No. Bimanual Exam:  Uterus:  normal size, contour, position, consistency, mobility, non-tender              Adnexa: normal adnexa and no mass, fullness, tenderness               Rectovaginal: Confirms               Anus:  normal sphincter tone, no lesions, no evidence of recurrent abscess/fistula  Chaperone, Ina Homes, CMA, was present for exam.  Assessment/Plan: 1. Encntr for gyn exam (general) (routine) w/o abn findings - Pap smear neg 2023.  Not indicated today. - Mammogram 05/26/2022 - Colonoscopy 2018, follow up 10 years - Bone mineral density is done with Dr. Jarold Motto - lab work done with PCP, Dr. Jarold Motto.  Has appt for June. - vaccines reviewed/updated  2. Family history of breast cancer - will order MRI again and try  to precert - MR BREAST BILATERAL W WO CONTRAST INC CAD; Future - Ambulatory referral to Genetics  3. Cervical cancer screening - Cytology - PAP( [facility])  4. Age-related osteoporosis without current pathological fracture - not on medication  5. Essential hypertension - followed by Dr. Jarold Motto  6. History of recurrent UTIs - followed by urology at  [facility]  At least 30 minutes of visit spent discussing breast hx, family hx, repeating breast cancer risk modeling, discussing prior authorization of MRI.  This was in additional to breast and pelvic exam and regular health maintenance.

## 2022-06-09 ENCOUNTER — Encounter (HOSPITAL_BASED_OUTPATIENT_CLINIC_OR_DEPARTMENT_OTHER): Payer: Self-pay | Admitting: Obstetrics & Gynecology

## 2022-06-09 DIAGNOSIS — Z803 Family history of malignant neoplasm of breast: Secondary | ICD-10-CM | POA: Insufficient documentation

## 2022-06-10 ENCOUNTER — Encounter (HOSPITAL_BASED_OUTPATIENT_CLINIC_OR_DEPARTMENT_OTHER): Payer: Self-pay | Admitting: Obstetrics & Gynecology

## 2022-06-11 LAB — CYTOLOGY - PAP: Diagnosis: NEGATIVE

## 2022-06-12 DIAGNOSIS — M792 Neuralgia and neuritis, unspecified: Secondary | ICD-10-CM | POA: Diagnosis not present

## 2022-06-16 ENCOUNTER — Other Ambulatory Visit (HOSPITAL_BASED_OUTPATIENT_CLINIC_OR_DEPARTMENT_OTHER): Payer: Self-pay | Admitting: Obstetrics & Gynecology

## 2022-06-16 DIAGNOSIS — H524 Presbyopia: Secondary | ICD-10-CM | POA: Diagnosis not present

## 2022-06-27 ENCOUNTER — Other Ambulatory Visit: Payer: Medicare Other

## 2022-06-30 DIAGNOSIS — M48061 Spinal stenosis, lumbar region without neurogenic claudication: Secondary | ICD-10-CM | POA: Diagnosis not present

## 2022-07-01 ENCOUNTER — Other Ambulatory Visit: Payer: Medicare Other

## 2022-07-20 DIAGNOSIS — D649 Anemia, unspecified: Secondary | ICD-10-CM | POA: Diagnosis not present

## 2022-07-20 DIAGNOSIS — I10 Essential (primary) hypertension: Secondary | ICD-10-CM | POA: Diagnosis not present

## 2022-07-20 DIAGNOSIS — E785 Hyperlipidemia, unspecified: Secondary | ICD-10-CM | POA: Diagnosis not present

## 2022-07-20 DIAGNOSIS — E559 Vitamin D deficiency, unspecified: Secondary | ICD-10-CM | POA: Diagnosis not present

## 2022-07-21 DIAGNOSIS — R82998 Other abnormal findings in urine: Secondary | ICD-10-CM | POA: Diagnosis not present

## 2022-07-27 DIAGNOSIS — Z0000 Encounter for general adult medical examination without abnormal findings: Secondary | ICD-10-CM | POA: Diagnosis not present

## 2022-07-27 DIAGNOSIS — I739 Peripheral vascular disease, unspecified: Secondary | ICD-10-CM | POA: Diagnosis not present

## 2022-07-27 DIAGNOSIS — M810 Age-related osteoporosis without current pathological fracture: Secondary | ICD-10-CM | POA: Diagnosis not present

## 2022-07-27 DIAGNOSIS — I10 Essential (primary) hypertension: Secondary | ICD-10-CM | POA: Diagnosis not present

## 2022-07-28 ENCOUNTER — Encounter: Payer: Medicare Other | Admitting: Genetic Counselor

## 2022-07-28 ENCOUNTER — Other Ambulatory Visit: Payer: Medicare Other

## 2022-08-05 ENCOUNTER — Encounter (HOSPITAL_BASED_OUTPATIENT_CLINIC_OR_DEPARTMENT_OTHER): Payer: Self-pay | Admitting: Obstetrics & Gynecology

## 2022-08-06 ENCOUNTER — Encounter (HOSPITAL_BASED_OUTPATIENT_CLINIC_OR_DEPARTMENT_OTHER): Payer: Self-pay | Admitting: Obstetrics & Gynecology

## 2022-08-07 ENCOUNTER — Ambulatory Visit
Admission: RE | Admit: 2022-08-07 | Discharge: 2022-08-07 | Disposition: A | Discharge status: Home / Self Care | Payer: Medicare Other | Source: Ambulatory Visit | Attending: Obstetrics & Gynecology | Admitting: Obstetrics & Gynecology

## 2022-08-07 DIAGNOSIS — Z803 Family history of malignant neoplasm of breast: Secondary | ICD-10-CM | POA: Diagnosis not present

## 2022-08-07 DIAGNOSIS — Z1231 Encounter for screening mammogram for malignant neoplasm of breast: Secondary | ICD-10-CM | POA: Diagnosis not present

## 2022-08-07 MED ORDER — GADOPICLENOL 0.5 MMOL/ML IV SOLN
5.0000 mL | Freq: Once | INTRAVENOUS | Status: AC | PRN
  Administered 2022-08-07: 5 mL via INTRAVENOUS

## 2022-09-21 DIAGNOSIS — M4316 Spondylolisthesis, lumbar region: Secondary | ICD-10-CM | POA: Diagnosis not present

## 2022-09-21 DIAGNOSIS — M5416 Radiculopathy, lumbar region: Secondary | ICD-10-CM | POA: Diagnosis not present

## 2022-09-21 DIAGNOSIS — M48061 Spinal stenosis, lumbar region without neurogenic claudication: Secondary | ICD-10-CM | POA: Diagnosis not present

## 2022-09-30 DIAGNOSIS — H524 Presbyopia: Secondary | ICD-10-CM | POA: Diagnosis not present

## 2022-10-12 DIAGNOSIS — K219 Gastro-esophageal reflux disease without esophagitis: Secondary | ICD-10-CM | POA: Diagnosis not present

## 2022-10-12 DIAGNOSIS — K5900 Constipation, unspecified: Secondary | ICD-10-CM | POA: Diagnosis not present

## 2022-11-25 DIAGNOSIS — M810 Age-related osteoporosis without current pathological fracture: Secondary | ICD-10-CM | POA: Diagnosis not present

## 2022-11-25 DIAGNOSIS — Z23 Encounter for immunization: Secondary | ICD-10-CM | POA: Diagnosis not present

## 2022-12-16 DIAGNOSIS — M792 Neuralgia and neuritis, unspecified: Secondary | ICD-10-CM | POA: Diagnosis not present

## 2022-12-16 DIAGNOSIS — G250 Essential tremor: Secondary | ICD-10-CM | POA: Diagnosis not present

## 2023-01-01 DIAGNOSIS — R8290 Unspecified abnormal findings in urine: Secondary | ICD-10-CM | POA: Diagnosis not present

## 2023-01-01 DIAGNOSIS — Z87440 Personal history of urinary (tract) infections: Secondary | ICD-10-CM | POA: Diagnosis not present

## 2023-01-25 DIAGNOSIS — Z87440 Personal history of urinary (tract) infections: Secondary | ICD-10-CM | POA: Diagnosis not present

## 2023-03-08 ENCOUNTER — Ambulatory Visit (HOSPITAL_BASED_OUTPATIENT_CLINIC_OR_DEPARTMENT_OTHER): Payer: Medicare Other | Admitting: Obstetrics & Gynecology

## 2023-04-21 ENCOUNTER — Other Ambulatory Visit: Payer: Self-pay | Admitting: *Deleted

## 2023-04-21 DIAGNOSIS — D696 Thrombocytopenia, unspecified: Secondary | ICD-10-CM

## 2023-04-21 DIAGNOSIS — D649 Anemia, unspecified: Secondary | ICD-10-CM

## 2023-04-22 ENCOUNTER — Inpatient Hospital Stay: Payer: Medicare Other | Attending: Hematology and Oncology | Admitting: Hematology and Oncology

## 2023-04-22 ENCOUNTER — Inpatient Hospital Stay: Payer: Medicare Other

## 2023-04-22 VITALS — BP 130/87 | HR 66 | Temp 97.1°F | Resp 16 | Wt 100.1 lb

## 2023-04-22 DIAGNOSIS — D649 Anemia, unspecified: Secondary | ICD-10-CM | POA: Diagnosis not present

## 2023-04-22 DIAGNOSIS — Z800 Family history of malignant neoplasm of digestive organs: Secondary | ICD-10-CM | POA: Insufficient documentation

## 2023-04-22 DIAGNOSIS — D696 Thrombocytopenia, unspecified: Secondary | ICD-10-CM

## 2023-04-22 DIAGNOSIS — Z803 Family history of malignant neoplasm of breast: Secondary | ICD-10-CM | POA: Diagnosis not present

## 2023-04-22 DIAGNOSIS — M810 Age-related osteoporosis without current pathological fracture: Secondary | ICD-10-CM | POA: Diagnosis not present

## 2023-04-22 DIAGNOSIS — M549 Dorsalgia, unspecified: Secondary | ICD-10-CM | POA: Diagnosis not present

## 2023-04-22 DIAGNOSIS — G8929 Other chronic pain: Secondary | ICD-10-CM | POA: Diagnosis not present

## 2023-04-22 LAB — CBC WITH DIFFERENTIAL (CANCER CENTER ONLY)
Abs Immature Granulocytes: 0.01 10*3/uL (ref 0.00–0.07)
Basophils Absolute: 0 10*3/uL (ref 0.0–0.1)
Basophils Relative: 1 %
Eosinophils Absolute: 0.2 10*3/uL (ref 0.0–0.5)
Eosinophils Relative: 4 %
HCT: 37 % (ref 36.0–46.0)
Hemoglobin: 12.3 g/dL (ref 12.0–15.0)
Immature Granulocytes: 0 %
Lymphocytes Relative: 35 %
Lymphs Abs: 1.5 10*3/uL (ref 0.7–4.0)
MCH: 29.4 pg (ref 26.0–34.0)
MCHC: 33.2 g/dL (ref 30.0–36.0)
MCV: 88.5 fL (ref 80.0–100.0)
Monocytes Absolute: 0.3 10*3/uL (ref 0.1–1.0)
Monocytes Relative: 8 %
Neutro Abs: 2.1 10*3/uL (ref 1.7–7.7)
Neutrophils Relative %: 52 %
Platelet Count: 152 10*3/uL (ref 150–400)
RBC: 4.18 MIL/uL (ref 3.87–5.11)
RDW: 12.3 % (ref 11.5–15.5)
WBC Count: 4.1 10*3/uL (ref 4.0–10.5)
nRBC: 0 % (ref 0.0–0.2)

## 2023-04-22 LAB — CMP (CANCER CENTER ONLY)
ALT: 12 U/L (ref 0–44)
AST: 18 U/L (ref 15–41)
Albumin: 4.5 g/dL (ref 3.5–5.0)
Alkaline Phosphatase: 61 U/L (ref 38–126)
Anion gap: 4 — ABNORMAL LOW (ref 5–15)
BUN: 14 mg/dL (ref 8–23)
CO2: 31 mmol/L (ref 22–32)
Calcium: 9.5 mg/dL (ref 8.9–10.3)
Chloride: 104 mmol/L (ref 98–111)
Creatinine: 0.58 mg/dL (ref 0.44–1.00)
GFR, Estimated: >60 mL/min (ref >60)
Glucose, Bld: 84 mg/dL (ref 70–99)
Potassium: 3.9 mmol/L (ref 3.5–5.1)
Sodium: 139 mmol/L (ref 135–145)
Total Bilirubin: 0.7 mg/dL (ref 0.0–1.2)
Total Protein: 7 g/dL (ref 6.5–8.1)

## 2023-04-22 LAB — VITAMIN B12: Vitamin B-12: 626 pg/mL (ref 180–914)

## 2023-04-22 LAB — VITAMIN D 25 HYDROXY (VIT D DEFICIENCY, FRACTURES): Vit D, 25-Hydroxy: 49.64 ng/mL (ref 30–100)

## 2023-04-22 NOTE — Progress Notes (Signed)
 [facility] CONSULT NOTE  Patient Care Team: Garlan Fillers, MD as PCP - General (Internal Medicine) Garlan Fillers, MD (Internal Medicine)  CHIEF COMPLAINTS/PURPOSE OF CONSULTATION:  Normocytic normochromic anemia.  ASSESSMENT & PLAN:   This is a very pleasant 68 year old female patient with past medical history is significant for hypertension referred to hematology for evaluation of mild normocytic normochromic anemia.    Normocytic normochromic anemia There is no evidence of nutritional deficiency, iron panel, B12 normal. Serum electrophoresis normal, no evidence of M protein. No evidence of hemolysis, autoimmune disease, TSH normal, folic acid levels are normal Labs from today with no anemia or thrombocytopenia  Chronic Back Pain Chronic back pain due to pars defect, pinched nerve at L4-L5, and protruding disc. Previous treatments intensified pain. Limited exercise capacity.  Stress Management Significant stress from personal circumstances exacerbating gastrointestinal symptoms. - Encourage stress management techniques and self-care. - Discuss importance of personal time despite caregiving.  Osteoporosis Osteoporosis without specific concerns or changes.  Follow-up Lab results normal, no immediate hematology/oncology concerns. - Coordinate follow-up with Dr. Jarold Motto. - Request additional labs for Vitamin D and B12. - Release lab report to MyChart. -Since she had two sets of labs which are normal, we will release her back to the PCP.   HISTORY OF PRESENTING ILLNESS:  Kayla Guerra 68 y.o. female is here because of normocytic normochromic anemia.  This is a very pleasant 68 year old female patient with past medical history significant for hypertension, headaches referred to hematology because of ongoing fatigue and mild normocytic normochromic anemia.    Interval History  Kayla Guerra is a 68 year old female who presents for follow-up  of her blood counts and general health.  She is concerned about her anemia, although her recent hemoglobin level is 12.3, which is stable compared to a year ago. She is not currently taking any medications and monitors her blood pressure at home, which is usually around 101 to 120, but it tends to be higher in medical settings.  She experiences ongoing stress due to recent family events, including the unexpected suicide of her brother in August, which has been emotionally challenging.Additionally, she cares for her handicapped son, which adds to her stress levels. She mentions having stomach issues that have improved but still flare up with stress.   She drinks water regularly but has reduced her exercise due to back problems.   She has a history of osteoporosis and is concerned about her bone health. She inquires about her vitamin D levels.  Rest of the pertinent 10 point ROS reviewed and neg.  MEDICAL HISTORY:  Past Medical History:  Diagnosis Date   Anxiety    BRCA gene mutation negative    Chronic constipation    GERD (gastroesophageal reflux disease)    Headache(784.0)    Hypertension    PONV (postoperative nausea and vomiting)    Wears contact lenses     SURGICAL HISTORY: Past Surgical History:  Procedure Laterality Date   ANAL FISTULECTOMY  11/2021   [facility]. [facility]achel [facility] at [facility]   BREAST EXCISIONAL BIOPSY Right 06-26-1999    dr Luan Pulling  @[facility]    CHOLECYSTECTOMY N/A 02/22/2018   Procedure: LAPAROSCOPIC CHOLECYSTECTOMY WITH INTRAOPERATIVE CHOLANGIOGRAM ERAS PATHWAY;  Surgeon: Luretha Murphy, MD;  Location: [facility];  Service: General;  Laterality: N/A;   D & C HYSTEROSCOPY WITH RESECTION POLYPS  10-10-2009    dr rivard  @[facility]    DILATION AND CURETTAGE OF UTERUS  2006   WISDOM TOOTH  EXTRACTION      SOCIAL HISTORY: Social History   Socioeconomic History   Marital status: Married    Spouse name: Not on file   Number of children: 1   Years of education: Not on file   Highest  education level: Not on file  Occupational History   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  Tobacco Use   Smoking status: Never   Smokeless tobacco: Never  Vaping Use   Vaping status: Never Used  Substance and Sexual Activity   Alcohol use: Not Currently    Comment: occasional   Drug use: Never   Sexual activity: Yes    Birth control/protection: Surgical    Comment: vas  Other Topics Concern   Not on file  Social History Narrative   Right handed   Drinks caffeine   One story home   Social Drivers of Health   Financial Resource Strain: Low Risk  (05/20/2020)   [facility]   Overall Financial Resource Strain (CARDIA)  Food Insecurity: No Food Insecurity (05/20/2020)   [facility]   Hunger Vital Sign  Transportation Needs: No Transportation Needs (05/20/2020)   Received from [facility]   PRAPARE - Transportation  Physical Activity: Sufficiently Active (05/20/2020)   Received from [facility]   Exercise Vital Sign  Stress: No Stress Concern Present (05/20/2020)   Received from [facility]   [employer] of Occupational Health - Occupational Stress Questionnaire  Social Connections: Not on file  Intimate Partner Violence: Not on file    FAMILY HISTORY: Family History  Problem Relation Age of Onset   Heart disease Mother    Hypertension Mother    Thyroid disease Mother    Breast cancer Mother 26   Asthma Father    Heart disease Brother    Stroke Maternal Grandmother    ADD / ADHD Son    Other Son        mentally handicaped   Breast cancer Cousin 30   Colon cancer Cousin    Colon cancer Cousin    Breast cancer Maternal Aunt     ALLERGIES:  is allergic to tetracycline, tetracyclines & related, penicillins, and erythromycin.  MEDICATIONS:  Current Outpatient Medications  Medication Sig Dispense Refill   ALPRAZolam (XANAX) 0.5 MG tablet Take 0.5 mg  by mouth 2 (two) times daily as needed for anxiety or sleep.      Calcium-Phosphorus-Vitamin D (CITRACAL +D3 PO) Take by mouth.     NONFORMULARY OR COMPOUNDED ITEM Estradiol/vitamin e 0.02%/200IU/GM     NONFORMULARY OR COMPOUNDED ITEM Vitamin E vaginal suppositories 200u/ml.  One pv three times weekly. 36 each 3   Omega-3 Fatty Acids (OMEGA-3 2100 PO) Take by mouth.     Vitamin D, Ergocalciferol, (DRISDOL) 1.25 MG (50000 UNIT) CAPS capsule Take 50,000 Units by mouth every 7 (seven) days.     No current facility-administered medications for this visit.     PHYSICAL EXAMINATION: ECOG PERFORMANCE STATUS: 0 - Asymptomatic  Vitals:   04/22/23 1116  BP: 130/87  Pulse: 66  Resp: 16  Temp: (!) 97.1 F (36.2 C)  SpO2: 100%    Filed Weights   04/22/23 1116  Weight: 100 lb 1.6 oz (45.4 kg)    Physical Exam Constitutional:      Appearance: Normal appearance.  Cardiovascular:     Rate and Rhythm: Normal rate and regular rhythm.  Abdominal:     General: Abdomen  is flat. Bowel sounds are normal.     Palpations: Abdomen is soft.  Musculoskeletal:        General: No swelling. Normal range of motion.     Cervical back: Normal range of motion and neck supple. No rigidity.  Lymphadenopathy:     Cervical: No cervical adenopathy.  Skin:    General: Skin is warm and dry.  Neurological:     General: No focal deficit present.     Mental Status: She is alert.  Psychiatric:        Mood and Affect: Mood normal.    LABORATORY DATA:  I have reviewed the data as listed Lab Results  Component Value Date   WBC 4.1 04/22/2023   HGB 12.3 04/22/2023   HCT 37.0 04/22/2023   MCV 88.5 04/22/2023   PLT 152 04/22/2023     Chemistry      Component Value Date/Time   NA 138 04/20/2022 1039   K 3.6 04/20/2022 1039   CL 104 04/20/2022 1039   CO2 28 04/20/2022 1039   BUN 13 04/20/2022 1039   CREATININE 0.59 04/20/2022 1039   CREATININE 0.63 11/25/2021 1037      Component Value Date/Time    CALCIUM 9.4 04/20/2022 1039   ALKPHOS 88 04/20/2022 1039   AST 19 04/20/2022 1039   AST 19 11/25/2021 1037   ALT 15 04/20/2022 1039   ALT 14 11/25/2021 1037   BILITOT 0.7 04/20/2022 1039   BILITOT 0.5 11/25/2021 1037     CBC from today showed normal white blood cell count, hemoglobin as well as platelet count. CMP showed normal creatinine, no evidence of transaminitis TSH is normal B12 is normal  RADIOGRAPHIC STUDIES: I have personally reviewed the radiological images as listed and agreed with the findings in the report. No results found.  All questions were answered. The patient knows to call the clinic with any problems, questions or concerns. I spent 30 minutes in the care of this patient including H and P, review of records, counseling and coordination of care.     Rachel Moulds, MD 04/22/2023 11:27 AM

## 2023-04-26 ENCOUNTER — Other Ambulatory Visit: Payer: Self-pay | Admitting: Obstetrics & Gynecology

## 2023-04-26 DIAGNOSIS — Z1231 Encounter for screening mammogram for malignant neoplasm of breast: Secondary | ICD-10-CM

## 2023-05-10 DIAGNOSIS — D225 Melanocytic nevi of trunk: Secondary | ICD-10-CM | POA: Diagnosis not present

## 2023-05-10 DIAGNOSIS — R208 Other disturbances of skin sensation: Secondary | ICD-10-CM | POA: Diagnosis not present

## 2023-05-10 DIAGNOSIS — L814 Other melanin hyperpigmentation: Secondary | ICD-10-CM | POA: Diagnosis not present

## 2023-05-10 DIAGNOSIS — I788 Other diseases of capillaries: Secondary | ICD-10-CM | POA: Diagnosis not present

## 2023-05-26 ENCOUNTER — Ambulatory Visit
Admission: RE | Admit: 2023-05-26 | Discharge: 2023-05-26 | Disposition: A | Discharge status: Home / Self Care | Source: Ambulatory Visit | Attending: Obstetrics & Gynecology | Admitting: Obstetrics & Gynecology

## 2023-05-26 DIAGNOSIS — Z1231 Encounter for screening mammogram for malignant neoplasm of breast: Secondary | ICD-10-CM

## 2023-06-08 DIAGNOSIS — H524 Presbyopia: Secondary | ICD-10-CM | POA: Diagnosis not present

## 2023-06-08 DIAGNOSIS — H5203 Hypermetropia, bilateral: Secondary | ICD-10-CM | POA: Diagnosis not present

## 2023-06-08 DIAGNOSIS — H2513 Age-related nuclear cataract, bilateral: Secondary | ICD-10-CM | POA: Diagnosis not present

## 2023-06-08 DIAGNOSIS — H52221 Regular astigmatism, right eye: Secondary | ICD-10-CM | POA: Diagnosis not present

## 2023-06-10 ENCOUNTER — Ambulatory Visit (HOSPITAL_BASED_OUTPATIENT_CLINIC_OR_DEPARTMENT_OTHER): Payer: Medicare Other | Admitting: Obstetrics & Gynecology

## 2023-06-10 ENCOUNTER — Encounter (HOSPITAL_BASED_OUTPATIENT_CLINIC_OR_DEPARTMENT_OTHER): Payer: Self-pay | Admitting: Obstetrics & Gynecology

## 2023-06-10 VITALS — BP 133/86 | HR 74 | Ht 62.5 in | Wt 100.2 lb

## 2023-06-10 DIAGNOSIS — Z01419 Encounter for gynecological examination (general) (routine) without abnormal findings: Secondary | ICD-10-CM

## 2023-06-10 DIAGNOSIS — D696 Thrombocytopenia, unspecified: Secondary | ICD-10-CM | POA: Diagnosis not present

## 2023-06-10 DIAGNOSIS — Z803 Family history of malignant neoplasm of breast: Secondary | ICD-10-CM

## 2023-06-10 DIAGNOSIS — M810 Age-related osteoporosis without current pathological fracture: Secondary | ICD-10-CM

## 2023-06-10 DIAGNOSIS — D649 Anemia, unspecified: Secondary | ICD-10-CM

## 2023-06-10 DIAGNOSIS — I10 Essential (primary) hypertension: Secondary | ICD-10-CM

## 2023-06-10 DIAGNOSIS — Z780 Asymptomatic menopausal state: Secondary | ICD-10-CM

## 2023-06-10 NOTE — Progress Notes (Signed)
 Breast and Pelvic Patient name: Kayla Guerra MRN [id number]  Date of birth: 1955/04/27 Chief Complaint:   Breast and Pelvic  History of Present Illness:   Kayla Guerra is a 68 y.o. G24P0101 Caucasian female being seen today for breast and pelvic exam.  Denies vaginal bleeding.    Brother passed away since I saw her last.  Has been very hard.  Seeing Dr. Adan Holms, PCP, in June.   Using vaginal estradiol and vit e cream.  Dr. Rolande Cleverly, urology, in [facility] writes this for her.  He may retire soon.  She knows I can manage this in the future if needed.  H/o breast cancer with her mother with recurrence.  Has grade D breast density.  Had difficulty with getting breast MRI covered.      Patient's last menstrual period was 07/24/2010.  Last pap 06/08/2022. Results were:  negative .  Last mammogram: 05/26/2023. Results were: normal. Family h/o breast cancer: yes mother, age 59 Last colonoscopy: 05/18/2016 . Results were: normal. Family h/o colorectal cancer: yes :  cousin DEXA:  done at [facility]     06/10/2023    9:16 AM 06/08/2022    9:04 AM 06/04/2021    3:31 PM  Depression screen PHQ 2/9  Decreased Interest 0 0 0  Down, Depressed, Hopeless 0 0 0  PHQ - 2 Score 0 0 0    Review of Systems:   Pertinent items are noted in HPI  Denies any vaginal bleeding.  Denies urinary changes.  Has chronic constipation.    Pertinent History Reviewed:  Reviewed past medical,surgical, social and family history.  Reviewed problem list, medications and allergies. Physical Assessment:   Vitals:   06/10/23 0913  BP: 133/86  Pulse: 74  Weight: 100 lb 3.2 oz (45.5 kg)  Height: 5' 2.5" (1.588 m)  Body mass index is 18.03 kg/m.        Physical Examination:   General appearance - well appearing, and in no distress  Mental status - alert, oriented to person, place, and time  Psych:  She has a normal mood and affect  Skin - warm and dry, normal color, no suspicious lesions  noted  Chest - effort normal, all lung fields clear to auscultation bilaterally  Heart - normal rate and regular rhythm  Neck:  midline trachea, no thyromegaly or nodules  Breasts - breasts appear normal, no suspicious masses, no skin or nipple changes or  axillary nodes  Abdomen - soft, nontender, nondistended, no masses or organomegaly  Pelvic - VULVA: normal appearing vulva with no masses, tenderness or lesions    VAGINA: normal appearing vagina with normal color and discharge, no lesions    CERVIX: normal appearing cervix without discharge or lesions, no CMT  Thin prep pap is not done.  UTERUS: uterus is felt to be normal size, shape, consistency and nontender   ADNEXA: No adnexal masses or tenderness noted.  Rectal - normal rectal, good sphincter tone, no masses felt.  Extremities:  No swelling or varicosities noted  Chaperone present for exam  Assessment & Plan:  1. Encntr for gyn exam (general) (routine) w/o abn findings (Primary) - Pap smear done 2024.  Negative.  Not indicated today. - Mammogram 05/26/2023 - Colonoscopy 05/18/2016 - Bone mineral density done with PCP at [facility] - lab work done with PCP, Dr. Efraim Grange - vaccines reviewed/updated  2. Family history of breast cancer - will plan to order breast MRI in June.  3. Age-related osteoporosis without current pathological fracture - Currently not on any medication - she is taking VIt D and calcium, adequate dosing for current diagnosis  4. H/o normocytic normochromic anemia - has been followed by Dr. Arno Bibles  5.  PMP - not on HRT  No orders of the defined types were placed in this encounter.   Meds: No orders of the defined types were placed in this encounter.   Follow-up: No follow-ups on file.  [name], MD 06/10/2023 7:21 PM

## 2023-06-10 NOTE — Patient Instructions (Signed)
 Vaccinations to discuss:  Prevnar (pneumonia) and tdap

## 2023-06-16 DIAGNOSIS — M792 Neuralgia and neuritis, unspecified: Secondary | ICD-10-CM | POA: Diagnosis not present

## 2023-06-16 DIAGNOSIS — R29818 Other symptoms and signs involving the nervous system: Secondary | ICD-10-CM | POA: Diagnosis not present

## 2023-06-16 DIAGNOSIS — G250 Essential tremor: Secondary | ICD-10-CM | POA: Diagnosis not present

## 2023-06-22 NOTE — Therapy (Signed)
 OUTPATIENT PHYSICAL THERAPY NEURO EVALUATION   Patient Name: Kayla Guerra MRN: [id number] DOB:[birth year], 68 y.o., female Today's Date: 06/23/2023   PCP: Bertha Broad, MD  REFERRING PROVIDER: Dinah Franco, MD  END OF SESSION:  PT End of Session - 06/23/23 [phone]     Visit Number 1    Number of Visits 7    Date for PT Re-Evaluation 08/04/23    Authorization Type BCBS Medicare    PT Start Time 0848    PT Stop Time 0928    PT Time Calculation (min) 40 min    Equipment Utilized During Treatment Gait belt    Activity Tolerance Patient tolerated treatment well    Behavior During Therapy WFL for tasks assessed/performed             Past Medical History:  Diagnosis Date   Anxiety    BRCA gene mutation negative    Chronic constipation    GERD (gastroesophageal reflux disease)    Headache(784.0)    Hypertension    PONV (postoperative nausea and vomiting)    Wears contact lenses    Past Surgical History:  Procedure Laterality Date   ANAL FISTULECTOMY  11/2021   Dr. Vida Gravely at [facility]   BREAST EXCISIONAL BIOPSY Right 06-26-1999    dr Laraine Plate  @[facility]    CHOLECYSTECTOMY N/A 02/22/2018   Procedure: LAPAROSCOPIC CHOLECYSTECTOMY WITH INTRAOPERATIVE CHOLANGIOGRAM ERAS PATHWAY;  Surgeon: Jacolyn Matar, MD;  Location: [facility];  Service: General;  Laterality: N/A;   D & C HYSTEROSCOPY WITH RESECTION POLYPS  10-10-2009    dr rivard  @[facility]    DILATION AND CURETTAGE OF UTERUS  2006   WISDOM TOOTH EXTRACTION     Patient Active Problem List   Diagnosis Date Noted   Family history of breast cancer 06/09/2022   Normocytic normochromic anemia 06/17/2021   Thrombocytopenia (HCC) 06/17/2021   Age-related osteoporosis without current pathological fracture 05/05/2021   History of recurrent UTIs 12/29/2018   Status post laparoscopic cholecystectomy Jan 2020 02/22/2018   Menopause 07/17/2011   Essential hypertension 01/05/2011    ONSET DATE: 3 years  REFERRING DIAG:  R29.818 Difficulty Balancing  THERAPY DIAG:  Muscle weakness (generalized)  Difficulty in walking, not elsewhere classified  Rationale for Evaluation and Treatment: Rehabilitation  SUBJECTIVE:                                                                                                                                                                                             SUBJECTIVE STATEMENT: Patient reports that an MRI found a pars defect and a pinched nerve in her back which was done d/t  c/o B foot pain. Was recommended to exercise but reports remaining B foot pain. Reports neuropathy was ruled out. Reports imbalance on uneven surfaces like a yard. Reports UE and LE weakness, R knee pain when walking. Reports that she is fearful of what she can do without flaring up her pain. Has a 68y/o handicapped son that she has to physically assist. Reports difficulty opening things but denies dropping items.   Pt accompanied by: self  PERTINENT HISTORY: anxiety, GERD, HA, HTN, per pt- osteoporosis and essential tremors  PAIN:  Are you having pain? Yes: NPRS scale: did not rate Pain location: LB Pain description: "it hurts" Aggravating factors: bending, prolonged sitting Relieving factors: sitting up straight when sitting  PRECAUTIONS: Fall  RED FLAGS: None   WEIGHT BEARING RESTRICTIONS: No  FALLS: Has patient fallen in last 6 months? No  LIVING ENVIRONMENT: Lives with: lives with their spouse and lives with their son (son requires physical assistance d/t disability) Lives in: House/apartment Stairs:  3-4 steps to enter ; 1 story Has following equipment at home: None  PLOF: Independent and retired; likes walking, going to church, working in the yard   PATIENT GOALS: improve strength and balance   OBJECTIVE:  Note: Objective measures were completed at Evaluation unless otherwise noted.  DIAGNOSTIC FINDINGS: none recent  COGNITION: Overall cognitive status: Within  functional limits for tasks assessed   SENSATION: Pt reports tingling in B feet  COORDINATION: Alternating pronation/supination: WNL Alternating toe tap: WNL Finger to nose: WFL, with tremor  POSTURE: forward head  LOWER EXTREMITY ROM:     Active  Right Eval Left Eval  Hip flexion    Hip extension    Hip abduction    Hip adduction    Hip internal rotation    Hip external rotation    Knee flexion    Knee extension    Ankle dorsiflexion 9 13  Ankle plantarflexion    Ankle inversion    Ankle eversion     (Blank rows = not tested)  LOWER EXTREMITY MMT:    MMT (in sitting) Right Eval Left Eval  Hip flexion 4+ 4  Hip extension    Hip abduction 4+ 4+  Hip adduction 4 4  Hip internal rotation    Hip external rotation    Knee flexion 4+ 4  Knee extension 4 4+  Ankle dorsiflexion 4 4  Ankle plantarflexion 4 4  Ankle inversion    Ankle eversion    (Blank rows = not tested)    GAIT: Findings: Assistive device utilized:None, Level of assistance: Complete Independence, and Comments: slight B toe out and very slight steppage gait   FUNCTIONAL TESTS:    M-CTSIB  Condition 1: Firm Surface, EO 30 Sec, Normal Sway  Condition 2: Firm Surface, EC 30 Sec, Normal Sway  Condition 3: Foam Surface, EO 30 Sec, Normal Sway  Condition 4: Foam Surface, EC 30 Sec, Mild Sway    [facility] PT Assessment - 06/23/23 0001       Standardized Balance Assessment   Standardized Balance Assessment Five Times Sit to Stand;10 meter walk test    Five times sit to stand comments  16.3 sec without UEs   slightly limited eccentric lower   10 Meter Walk 11.02 sec      Functional Gait  Assessment   Gait assessed  Yes    Gait Level Surface Walks 20 ft in less than 5.5 sec, no assistive devices, good speed, no evidence for imbalance, normal gait pattern,  deviates no more than 6 in outside of the 12 in walkway width.    Change in Gait Speed Able to smoothly change walking speed without loss of  balance or gait deviation. Deviate no more than 6 in outside of the 12 in walkway width.    Gait with Horizontal Head Turns Performs head turns smoothly with slight change in gait velocity (eg, minor disruption to smooth gait path), deviates 6-10 in outside 12 in walkway width, or uses an assistive device.    Gait with Vertical Head Turns Performs task with slight change in gait velocity (eg, minor disruption to smooth gait path), deviates 6 - 10 in outside 12 in walkway width or uses assistive device    Gait and Pivot Turn Pivot turns safely in greater than 3 sec and stops with no loss of balance, or pivot turns safely within 3 sec and stops with mild imbalance, requires small steps to catch balance.    Step Over Obstacle Is able to step over 2 stacked shoe boxes taped together (9 in total height) without changing gait speed. No evidence of imbalance.    Gait with Narrow Base of Support Is able to ambulate for 10 steps heel to toe with no staggering.    Gait with Eyes Closed Walks 20 ft, no assistive devices, good speed, no evidence of imbalance, normal gait pattern, deviates no more than 6 in outside 12 in walkway width. Ambulates 20 ft in less than 7 sec.    Ambulating Backwards Walks 20 ft, no assistive devices, good speed, no evidence for imbalance, normal gait    Steps Alternating feet, no rail.    Total Score 27                                                                                                                                          TREATMENT DATE: 06/23/23    PATIENT EDUCATION: Education details: prognosis, POC, exam findings, edu on low risk of falls but goal of improving balance confidence, edu on fitness regimen to continue moving forward Person educated: Patient Education method: Explanation, Demonstration, Tactile cues, Verbal cues, and Handouts Education comprehension: verbalized understanding and returned demonstration  HOME EXERCISE PROGRAM: Not initiated    GOALS: Goals reviewed with patient? Yes  SHORT TERM GOALS: Target date: 07/14/2023  Patient to be independent with initial HEP. Baseline: HEP initiated Goal status: INITIAL    LONG TERM GOALS: Target date: 08/04/2023  Patient to be independent with advanced HEP. Baseline: Not yet initiated  Goal status: INITIAL  Patient to demonstrate B LE strength >/=4+/5.  Baseline: See above Goal status: INITIAL  Patient to improve ABC score by at least 10 points.  Baseline: NT Goal status: INITIAL  Patient to report plans to participate in strength training regimen several times a week to maintain PT gains.   Baseline: not started  Goal status: INITIAL  ASSESSMENT:  CLINICAL IMPRESSION:  Patient is a 68 y/o F presenting to [facility] with c/o weakness and imbalance for the past 3 years. Reports B foot pain which she reports stems from her LB as well as R knee pain. Patient today presenting with limited R ankle DF AROM, B LE weakness, gait deviations, and limited eccentric control with STS. Patient with scores indicating low risk of falls with balance testing, however will need to improve balance confidence. Would benefit from skilled PT services 1 x/week for 6 weeks to address aforementioned impairments in order to optimize level of function.    OBJECTIVE IMPAIRMENTS: Abnormal gait, decreased balance, decreased ROM, decreased strength, impaired flexibility, and postural dysfunction.   ACTIVITY LIMITATIONS: carrying, lifting, and locomotion level  PARTICIPATION LIMITATIONS: community activity and yard work  PERSONAL FACTORS: Age, Past/current experiences, and 3+ comorbidities: anxiety, GERD, HA, HTN, per pt- osteoporosis and essential tremors  are also affecting patient's functional outcome.   REHAB POTENTIAL: Good  CLINICAL DECISION MAKING: Evolving/moderate complexity  EVALUATION COMPLEXITY: Moderate  PLAN:  PT FREQUENCY: 1x/week  PT DURATION: 6 weeks  PLANNED INTERVENTIONS:  97164- PT Re-evaluation, 97750- Physical Performance Testing, 97110-Therapeutic exercises, 97530- Therapeutic activity, 97112- Neuromuscular re-education, 97535- Self Care, 16109- Manual therapy, [phone]- Gait training, [phone]- Canalith repositioning, Patient/Family education, Balance training, Stair training, Taping, Dry Needling, Vestibular training, Cryotherapy, and Moist heat  PLAN FOR NEXT SESSION: have pt fill out ABC; initiate HEP for LE and UE strengthening while being mindful of LB and R knee pain ; high level balance challenges with goal of building balance confidence      Thaddeus Filippo, PT, DPT 06/23/23 11:52 AM  [facility] at [facility] [address], [address] [place], [place] [address] Phone # [phone] Fax # [phone]

## 2023-06-23 ENCOUNTER — Ambulatory Visit: Attending: Neurology | Admitting: Physical Therapy

## 2023-06-23 ENCOUNTER — Encounter: Payer: Self-pay | Admitting: Physical Therapy

## 2023-06-23 ENCOUNTER — Other Ambulatory Visit: Payer: Self-pay

## 2023-06-23 DIAGNOSIS — R262 Difficulty in walking, not elsewhere classified: Secondary | ICD-10-CM | POA: Insufficient documentation

## 2023-06-23 DIAGNOSIS — H524 Presbyopia: Secondary | ICD-10-CM | POA: Diagnosis not present

## 2023-06-23 DIAGNOSIS — M6281 Muscle weakness (generalized): Secondary | ICD-10-CM | POA: Diagnosis not present

## 2023-06-28 NOTE — Therapy (Signed)
 OUTPATIENT PHYSICAL THERAPY NEURO TREATMENT   Patient Name: Kayla Guerra MRN: [id number] DOB:11-25-1955, 68 y.o., female Today's Date: 06/29/2023   PCP: Bertha Broad, MD  REFERRING PROVIDER: Dinah Franco, MD  END OF SESSION:  PT End of Session - 06/29/23 1657     Visit Number 2    Number of Visits 7    Date for PT Re-Evaluation 08/04/23    Authorization Type BCBS Medicare    PT Start Time 1616    PT Stop Time 1656    PT Time Calculation (min) 40 min    Equipment Utilized During Treatment Gait belt    Activity Tolerance Patient tolerated treatment well    Behavior During Therapy WFL for tasks assessed/performed              Past Medical History:  Diagnosis Date   Anxiety    BRCA gene mutation negative    Chronic constipation    GERD (gastroesophageal reflux disease)    Headache(784.0)    Hypertension    PONV (postoperative nausea and vomiting)    Wears contact lenses    Past Surgical History:  Procedure Laterality Date   ANAL FISTULECTOMY  11/2021   Dr. Vida Gravely at [facility]   BREAST EXCISIONAL BIOPSY Right 06-26-1999    dr Laraine Plate  @[facility]    CHOLECYSTECTOMY N/A 02/22/2018   Procedure: LAPAROSCOPIC CHOLECYSTECTOMY WITH INTRAOPERATIVE CHOLANGIOGRAM ERAS PATHWAY;  Surgeon: Jacolyn Matar, MD;  Location: [facility];  Service: General;  Laterality: N/A;   D & C HYSTEROSCOPY WITH RESECTION POLYPS  10-10-2009    dr rivard  @[facility]    DILATION AND CURETTAGE OF UTERUS  2006   WISDOM TOOTH EXTRACTION     Patient Active Problem List   Diagnosis Date Noted   Family history of breast cancer 06/09/2022   Normocytic normochromic anemia 06/17/2021   Thrombocytopenia (HCC) 06/17/2021   Age-related osteoporosis without current pathological fracture 05/05/2021   History of recurrent UTIs 12/29/2018   Status post laparoscopic cholecystectomy Jan 2020 02/22/2018   Menopause 07/17/2011   Essential hypertension 01/05/2011    ONSET DATE: 3 years  REFERRING DIAG:  R29.818 Difficulty Balancing  THERAPY DIAG:  Muscle weakness (generalized)  Difficulty in walking, not elsewhere classified  Rationale for Evaluation and Treatment: Rehabilitation  SUBJECTIVE:                                                                                                                                                                                             SUBJECTIVE STATEMENT: A little sore in the LB.  Pt accompanied by: self  PERTINENT HISTORY: anxiety, GERD, HA, HTN, per  pt- osteoporosis and essential tremors  PAIN:  Are you having pain? Yes: NPRS scale: 5/10 Pain location: LB Pain description: "sore" Aggravating factors: bending, prolonged sitting Relieving factors: sitting up straight when sitting  PRECAUTIONS: Fall  RED FLAGS: None   WEIGHT BEARING RESTRICTIONS: No  FALLS: Has patient fallen in last 6 months? No  LIVING ENVIRONMENT: Lives with: lives with their spouse and lives with their son (son requires physical assistance d/t disability) Lives in: House/apartment Stairs: 3-4 steps to enter; 1 story Has following equipment at home: None  PLOF: Independent and retired; likes walking, going to church, working in the yard   PATIENT GOALS: improve strength and balance   OBJECTIVE:     TODAY'S TREATMENT: 06/29/23 Activity Comments  ABC 77.8%  squat to airex on chair 10x  standing HS curl 3# 2x10 each   walking on heels/toes resisted march 2x20 3# Sidestepping with yellow loop above knees  Good tolerance; demo for max carryover   Imbalance with walking on toes; performed heel raises instead 2x10                 HOME EXERCISE PROGRAM Last updated: 06/29/23 Access Code: [email or web address] URL: [email or web address] Date: 06/29/2023 Prepared by: [facility] - Outpatient  Rehab - [facility]  Exercises - Squat with Chair Touch  - 1 x daily - 5 x weekly - 2 sets - 10 reps - Standing Alternating Knee Flexion with Ankle  Weights  - 1 x daily - 5 x weekly - 2 sets - 10 reps - Standing March with Counter Support  - 1 x daily - 5 x weekly - 2 sets - 20 reps - Heel Raises with Counter Support  - 1 x daily - 5 x weekly - 2 sets - 10 reps - Side Stepping with Resistance at Thighs and Counter Support  - 1 x daily - 5 x weekly - 2 sets - 30 sec hold   PATIENT EDUCATION: Education details: HEP update and answered pt's questions on intended frequency of exercises; advised to perform every other day if muscles become too sore with these, advised to purchase 1-5lbs adjustable ankle weights  Person educated: Patient Education method: Explanation, Demonstration, Tactile cues, Verbal cues, and Handouts Education comprehension: verbalized understanding and returned demonstration    Note: Objective measures were completed at Evaluation unless otherwise noted.  DIAGNOSTIC FINDINGS: none recent  COGNITION: Overall cognitive status: Within functional limits for tasks assessed   SENSATION: Pt reports tingling in B feet  COORDINATION: Alternating pronation/supination: WNL Alternating toe tap: WNL Finger to nose: WFL, with tremor  POSTURE: forward head  LOWER EXTREMITY ROM:     Active  Right Eval Left Eval  Hip flexion    Hip extension    Hip abduction    Hip adduction    Hip internal rotation    Hip external rotation    Knee flexion    Knee extension    Ankle dorsiflexion 9 13  Ankle plantarflexion    Ankle inversion    Ankle eversion     (Blank rows = not tested)  LOWER EXTREMITY MMT:    MMT (in sitting) Right Eval Left Eval  Hip flexion 4+ 4  Hip extension    Hip abduction 4+ 4+  Hip adduction 4 4  Hip internal rotation    Hip external rotation    Knee flexion 4+ 4  Knee extension 4 4+  Ankle dorsiflexion 4 4  Ankle plantarflexion 4 4  Ankle inversion  Ankle eversion    (Blank rows = not tested)    GAIT: Findings: Assistive device utilized:None, Level of assistance: Complete  Independence, and Comments: slight B toe out and very slight steppage gait   FUNCTIONAL TESTS:    M-CTSIB  Condition 1: Firm Surface, EO 30 Sec, Normal Sway  Condition 2: Firm Surface, EC 30 Sec, Normal Sway  Condition 3: Foam Surface, EO 30 Sec, Normal Sway  Condition 4: Foam Surface, EC 30 Sec, Mild Sway    [facility] PT Assessment - 06/23/23 0001       Standardized Balance Assessment   Standardized Balance Assessment Five Times Sit to Stand;10 meter walk test    Five times sit to stand comments  16.3 sec without UEs   slightly limited eccentric lower   10 Meter Walk 11.02 sec      Functional Gait  Assessment   Gait assessed  Yes    Gait Level Surface Walks 20 ft in less than 5.5 sec, no assistive devices, good speed, no evidence for imbalance, normal gait pattern, deviates no more than 6 in outside of the 12 in walkway width.    Change in Gait Speed Able to smoothly change walking speed without loss of balance or gait deviation. Deviate no more than 6 in outside of the 12 in walkway width.    Gait with Horizontal Head Turns Performs head turns smoothly with slight change in gait velocity (eg, minor disruption to smooth gait path), deviates 6-10 in outside 12 in walkway width, or uses an assistive device.    Gait with Vertical Head Turns Performs task with slight change in gait velocity (eg, minor disruption to smooth gait path), deviates 6 - 10 in outside 12 in walkway width or uses assistive device    Gait and Pivot Turn Pivot turns safely in greater than 3 sec and stops with no loss of balance, or pivot turns safely within 3 sec and stops with mild imbalance, requires small steps to catch balance.    Step Over Obstacle Is able to step over 2 stacked shoe boxes taped together (9 in total height) without changing gait speed. No evidence of imbalance.    Gait with Narrow Base of Support Is able to ambulate for 10 steps heel to toe with no staggering.    Gait with Eyes Closed Walks 20 ft, no  assistive devices, good speed, no evidence of imbalance, normal gait pattern, deviates no more than 6 in outside 12 in walkway width. Ambulates 20 ft in less than 7 sec.    Ambulating Backwards Walks 20 ft, no assistive devices, good speed, no evidence for imbalance, normal gait    Steps Alternating feet, no rail.    Total Score 27  TREATMENT DATE: 06/23/23    PATIENT EDUCATION: Education details: prognosis, POC, exam findings, edu on low risk of falls but goal of improving balance confidence, edu on fitness regimen to continue moving forward Person educated: Patient Education method: Explanation, Demonstration, Tactile cues, Verbal cues, and Handouts Education comprehension: verbalized understanding and returned demonstration  HOME EXERCISE PROGRAM: Not initiated   GOALS: Goals reviewed with patient? Yes  SHORT TERM GOALS: Target date: 07/14/2023  Patient to be independent with initial HEP. Baseline: HEP initiated Goal status: IN PROGRESS    LONG TERM GOALS: Target date: 08/04/2023  Patient to be independent with advanced HEP. Baseline: Not yet initiated  Goal status: IN PROGRESS  Patient to demonstrate B LE strength >/=4+/5.  Baseline: See above Goal status: IN PROGRESS  Patient to improve ABC score by at least 10 points.  Baseline: 77.8% 06/29/23 Goal status: IN PROGRESS 06/29/23  Patient to report plans to participate in strength training regimen several times a week to maintain PT gains.   Baseline: not started  Goal status: IN PROGRESS  ASSESSMENT:  CLINICAL IMPRESSION: Patient arrived to session with report of moderate LBP at start of session. Worked on LE strengthening activities with use of weights and WBing to build strength and bone density. Patient tolerated activities well. Marked weakness in B calf muscles, causing  some imbalance. Pt reported understanding of HEP and without complaints upon leaving.    OBJECTIVE IMPAIRMENTS: Abnormal gait, decreased balance, decreased ROM, decreased strength, impaired flexibility, and postural dysfunction.   ACTIVITY LIMITATIONS: carrying, lifting, and locomotion level  PARTICIPATION LIMITATIONS: community activity and yard work  PERSONAL FACTORS: Age, Past/current experiences, and 3+ comorbidities: anxiety, GERD, HA, HTN, per pt- osteoporosis and essential tremors are also affecting patient's functional outcome.   REHAB POTENTIAL: Good  CLINICAL DECISION MAKING: Evolving/moderate complexity  EVALUATION COMPLEXITY: Moderate  PLAN:  PT FREQUENCY: 1x/week  PT DURATION: 6 weeks  PLANNED INTERVENTIONS: 97164- PT Re-evaluation, 97750- Physical Performance Testing, 97110-Therapeutic exercises, 97530- Therapeutic activity, 97112- Neuromuscular re-education, 97535- Self Care, 16109- Manual therapy, [phone]- Gait training, [phone]- Canalith repositioning, Patient/Family education, Balance training, Stair training, Taping, Dry Needling, Vestibular training, Cryotherapy, and Moist heat  PLAN FOR NEXT SESSION: review HEP; LE and UE strengthening while being mindful of LB and R knee pain ; high level balance challenges with goal of building balance confidence      Thaddeus Filippo, PT, DPT 06/29/23 4:58 PM  [facility] at [facility] [address] [place], [place] [address] Phone # [phone] Fax # [phone]

## 2023-06-29 ENCOUNTER — Encounter: Payer: Self-pay | Admitting: Physical Therapy

## 2023-06-29 ENCOUNTER — Ambulatory Visit: Admitting: Physical Therapy

## 2023-06-29 DIAGNOSIS — M6281 Muscle weakness (generalized): Secondary | ICD-10-CM | POA: Diagnosis not present

## 2023-06-29 DIAGNOSIS — R262 Difficulty in walking, not elsewhere classified: Secondary | ICD-10-CM | POA: Diagnosis not present

## 2023-07-05 NOTE — Therapy (Signed)
 OUTPATIENT PHYSICAL THERAPY NEURO TREATMENT   Patient Name: Kayla Guerra MRN: [id number] DOB:Mar 12, 1955, 68 y.o., female Today's Date: 07/06/2023   PCP: Bertha Broad, MD  REFERRING PROVIDER: Dinah Franco, MD  END OF SESSION:  PT End of Session - 07/06/23 1058     Visit Number 3    Number of Visits 7    Date for PT Re-Evaluation 08/04/23    Authorization Type BCBS Medicare    PT Start Time 1017    PT Stop Time 1059    PT Time Calculation (min) 42 min    Equipment Utilized During Treatment Gait belt    Activity Tolerance Patient tolerated treatment well    Behavior During Therapy WFL for tasks assessed/performed               Past Medical History:  Diagnosis Date   Anxiety    BRCA gene mutation negative    Chronic constipation    GERD (gastroesophageal reflux disease)    Headache(784.0)    Hypertension    PONV (postoperative nausea and vomiting)    Wears contact lenses    Past Surgical History:  Procedure Laterality Date   ANAL FISTULECTOMY  11/2021   Dr. Vida Gravely at [facility]   BREAST EXCISIONAL BIOPSY Right 06-26-1999    dr Laraine Plate  @[facility]    CHOLECYSTECTOMY N/A 02/22/2018   Procedure: LAPAROSCOPIC CHOLECYSTECTOMY WITH INTRAOPERATIVE CHOLANGIOGRAM ERAS PATHWAY;  Surgeon: Jacolyn Matar, MD;  Location: [facility];  Service: General;  Laterality: N/A;   D & C HYSTEROSCOPY WITH RESECTION POLYPS  10-10-2009    dr rivard  @[facility]    DILATION AND CURETTAGE OF UTERUS  2006   WISDOM TOOTH EXTRACTION     Patient Active Problem List   Diagnosis Date Noted   Family history of breast cancer 06/09/2022   Normocytic normochromic anemia 06/17/2021   Thrombocytopenia (HCC) 06/17/2021   Age-related osteoporosis without current pathological fracture 05/05/2021   History of recurrent UTIs 12/29/2018   Status post laparoscopic cholecystectomy Jan 2020 02/22/2018   Menopause 07/17/2011   Essential hypertension 01/05/2011    ONSET DATE: 3 years  REFERRING  DIAG: R29.818 Difficulty Balancing  THERAPY DIAG:  Muscle weakness (generalized)  Difficulty in walking, not elsewhere classified  Rationale for Evaluation and Treatment: Rehabilitation  SUBJECTIVE:                                                                                                                                                                                             SUBJECTIVE STATEMENT: Did her HEP and just recently got her ankle weights. Reports that she had some pain in the  L inner thigh that took 2 days to resolve. Not sure what caused it.   Pt accompanied by: self  PERTINENT HISTORY: anxiety, GERD, HA, HTN, per pt- osteoporosis and essential tremors  PAIN:  Are you having pain? Yes: NPRS scale: 4/10 Pain location: LB Pain description: "sore" Aggravating factors: bending, prolonged sitting Relieving factors: sitting up straight when sitting  PRECAUTIONS: Fall  RED FLAGS: None   WEIGHT BEARING RESTRICTIONS: No  FALLS: Has patient fallen in last 6 months? No  LIVING ENVIRONMENT: Lives with: lives with their spouse and lives with their son (son requires physical assistance d/t disability) Lives in: House/apartment Stairs: 3-4 steps to enter; 1 story Has following equipment at home: None  PLOF: Independent and retired; likes walking, going to church, working in the yard   PATIENT GOALS: improve strength and balance   OBJECTIVE:      TODAY'S TREATMENT: 07/06/23 Activity Comments  review of HEP: squat to airex on chair 10x standing HS curl 3# 10x each standing march 3# 20x standing heel raises 10x each  sidestepping with TB above knees Good form and tolerance. Required corrective cueing for heel raises. Adjusted TB loop to around ankles for increased challenge  standing on foam ant/pos wt shifts With and without UE  sidestepping onto/off foam Cues to slow down for increased challenge   1 foot on airex on step  More instability with L LE  stabilizing ; cues for heel-toe     PATIENT EDUCATION: Education details: discussed pt's pursuit of joining dance class for fitness, edu on multisensory balance and importance of gaining comfort outside of COM; HEP updated  Person educated: Patient Education method: Explanation, Demonstration, Tactile cues, Verbal cues, and Handouts Education comprehension: verbalized understanding and returned demonstration    HOME EXERCISE PROGRAM Access Code: [email or web address] URL: [email or web address] Date: 07/06/2023 Prepared by: [facility] - Outpatient  Rehab - [facility]  Exercises - Squat with Chair Touch  - 1 x daily - 5 x weekly - 2 sets - 10 reps - Standing Alternating Knee Flexion with Ankle Weights  - 1 x daily - 5 x weekly - 2 sets - 10 reps - Standing March with Counter Support  - 1 x daily - 5 x weekly - 2 sets - 20 reps - Heel Raises with Counter Support  - 1 x daily - 5 x weekly - 2 sets - 10 reps - Side Stepping with Resistance at Thighs and Counter Support  - 1 x daily - 5 x weekly - 2 sets - 30 sec hold - Standing Weight Shifting Forward and Backward  - 1 x daily - 5 x weekly - 2 sets - 10 reps - Forward Backward Weight Shift with Counter Support  - 1 x daily - 5 x weekly - 2 sets - 10 reps    Note: Objective measures were completed at Evaluation unless otherwise noted.  DIAGNOSTIC FINDINGS: none recent  COGNITION: Overall cognitive status: Within functional limits for tasks assessed   SENSATION: Pt reports tingling in B feet  COORDINATION: Alternating pronation/supination: WNL Alternating toe tap: WNL Finger to nose: WFL, with tremor  POSTURE: forward head  LOWER EXTREMITY ROM:     Active  Right Eval Left Eval  Hip flexion    Hip extension    Hip abduction    Hip adduction    Hip internal rotation    Hip external rotation    Knee flexion    Knee extension    Ankle dorsiflexion 9 13  Ankle plantarflexion    Ankle inversion    Ankle eversion      (Blank rows = not tested)  LOWER EXTREMITY MMT:    MMT (in sitting) Right Eval Left Eval  Hip flexion 4+ 4  Hip extension    Hip abduction 4+ 4+  Hip adduction 4 4  Hip internal rotation    Hip external rotation    Knee flexion 4+ 4  Knee extension 4 4+  Ankle dorsiflexion 4 4  Ankle plantarflexion 4 4  Ankle inversion    Ankle eversion    (Blank rows = not tested)    GAIT: Findings: Assistive device utilized:None, Level of assistance: Complete Independence, and Comments: slight B toe out and very slight steppage gait   FUNCTIONAL TESTS:    M-CTSIB  Condition 1: Firm Surface, EO 30 Sec, Normal Sway  Condition 2: Firm Surface, EC 30 Sec, Normal Sway  Condition 3: Foam Surface, EO 30 Sec, Normal Sway  Condition 4: Foam Surface, EC 30 Sec, Mild Sway    [facility] PT Assessment - 06/23/23 0001       Standardized Balance Assessment   Standardized Balance Assessment Five Times Sit to Stand;10 meter walk test    Five times sit to stand comments  16.3 sec without UEs   slightly limited eccentric lower   10 Meter Walk 11.02 sec      Functional Gait  Assessment   Gait assessed  Yes    Gait Level Surface Walks 20 ft in less than 5.5 sec, no assistive devices, good speed, no evidence for imbalance, normal gait pattern, deviates no more than 6 in outside of the 12 in walkway width.    Change in Gait Speed Able to smoothly change walking speed without loss of balance or gait deviation. Deviate no more than 6 in outside of the 12 in walkway width.    Gait with Horizontal Head Turns Performs head turns smoothly with slight change in gait velocity (eg, minor disruption to smooth gait path), deviates 6-10 in outside 12 in walkway width, or uses an assistive device.    Gait with Vertical Head Turns Performs task with slight change in gait velocity (eg, minor disruption to smooth gait path), deviates 6 - 10 in outside 12 in walkway width or uses assistive device    Gait and Pivot Turn  Pivot turns safely in greater than 3 sec and stops with no loss of balance, or pivot turns safely within 3 sec and stops with mild imbalance, requires small steps to catch balance.    Step Over Obstacle Is able to step over 2 stacked shoe boxes taped together (9 in total height) without changing gait speed. No evidence of imbalance.    Gait with Narrow Base of Support Is able to ambulate for 10 steps heel to toe with no staggering.    Gait with Eyes Closed Walks 20 ft, no assistive devices, good speed, no evidence of imbalance, normal gait pattern, deviates no more than 6 in outside 12 in walkway width. Ambulates 20 ft in less than 7 sec.    Ambulating Backwards Walks 20 ft, no assistive devices, good speed, no evidence for imbalance, normal gait    Steps Alternating feet, no rail.    Total Score 27  TREATMENT DATE: 06/23/23    PATIENT EDUCATION: Education details: prognosis, POC, exam findings, edu on low risk of falls but goal of improving balance confidence, edu on fitness regimen to continue moving forward Person educated: Patient Education method: Explanation, Demonstration, Tactile cues, Verbal cues, and Handouts Education comprehension: verbalized understanding and returned demonstration  HOME EXERCISE PROGRAM: Not initiated   GOALS: Goals reviewed with patient? Yes  SHORT TERM GOALS: Target date: 07/14/2023  Patient to be independent with initial HEP. Baseline: HEP initiated Goal status: MET    LONG TERM GOALS: Target date: 08/04/2023  Patient to be independent with advanced HEP. Baseline: Not yet initiated  Goal status: IN PROGRESS  Patient to demonstrate B LE strength >/=4+/5.  Baseline: See above Goal status: IN PROGRESS  Patient to improve ABC score by at least 10 points.  Baseline: 77.8% 06/29/23 Goal status: IN PROGRESS  06/29/23  Patient to report plans to participate in strength training regimen several times a week to maintain PT gains.   Baseline: not started  Goal status: IN PROGRESS  ASSESSMENT:  CLINICAL IMPRESSION: Patient arrived to session with report of some L adductor discomfort which has since resolved. Patient performed HEP thoroughly and with good form and tolerance today. Proceeded with high level balance activities incorporating compliant surface. Patient tolerated exercises well; provided edu to improve comfort with max wt shift. Patient without complaints at end of session.    OBJECTIVE IMPAIRMENTS: Abnormal gait, decreased balance, decreased ROM, decreased strength, impaired flexibility, and postural dysfunction.   ACTIVITY LIMITATIONS: carrying, lifting, and locomotion level  PARTICIPATION LIMITATIONS: community activity and yard work  PERSONAL FACTORS: Age, Past/current experiences, and 3+ comorbidities: anxiety, GERD, HA, HTN, per pt- osteoporosis and essential tremors are also affecting patient's functional outcome.   REHAB POTENTIAL: Good  CLINICAL DECISION MAKING: Evolving/moderate complexity  EVALUATION COMPLEXITY: Moderate  PLAN:  PT FREQUENCY: 1x/week  PT DURATION: 6 weeks  PLANNED INTERVENTIONS: 97164- PT Re-evaluation, 97750- Physical Performance Testing, 97110-Therapeutic exercises, 97530- Therapeutic activity, 97112- Neuromuscular re-education, 97535- Self Care, 16109- Manual therapy, [phone]- Gait training, [phone]- Canalith repositioning, Patient/Family education, Balance training, Stair training, Taping, Dry Needling, Vestibular training, Cryotherapy, and Moist heat  PLAN FOR NEXT SESSION: LE and UE strengthening while being mindful of LB and R knee pain ; high level balance challenges with goal of building balance confidence      Thaddeus Filippo, PT, DPT 07/06/23 11:00 AM  [facility] at [facility] [address],  [address] [place], [place] [address] Phone # [phone] Fax # [phone]

## 2023-07-06 ENCOUNTER — Ambulatory Visit: Admitting: Physical Therapy

## 2023-07-06 ENCOUNTER — Encounter: Payer: Self-pay | Admitting: Physical Therapy

## 2023-07-06 DIAGNOSIS — R262 Difficulty in walking, not elsewhere classified: Secondary | ICD-10-CM | POA: Diagnosis not present

## 2023-07-06 DIAGNOSIS — M6281 Muscle weakness (generalized): Secondary | ICD-10-CM | POA: Diagnosis not present

## 2023-07-07 DIAGNOSIS — K080 Exfoliation of teeth due to systemic causes: Secondary | ICD-10-CM | POA: Diagnosis not present

## 2023-07-14 ENCOUNTER — Ambulatory Visit: Admitting: Physical Therapy

## 2023-07-14 ENCOUNTER — Encounter: Payer: Self-pay | Admitting: Physical Therapy

## 2023-07-14 DIAGNOSIS — R262 Difficulty in walking, not elsewhere classified: Secondary | ICD-10-CM | POA: Diagnosis not present

## 2023-07-14 DIAGNOSIS — M6281 Muscle weakness (generalized): Secondary | ICD-10-CM

## 2023-07-14 NOTE — Therapy (Signed)
 OUTPATIENT PHYSICAL THERAPY NEURO TREATMENT   Patient Name: Kayla Guerra MRN: [id number] DOB:03-12-1955, 68 y.o., female Today's Date: 07/15/2023   PCP: Bertha Broad, MD  REFERRING PROVIDER: Dinah Franco, MD  END OF SESSION:  PT End of Session - 07/14/23 1449     Visit Number 4    Number of Visits 7    Date for PT Re-Evaluation 08/04/23    Authorization Type BCBS Medicare    PT Start Time 1451    PT Stop Time 1530    PT Time Calculation (min) 39 min    Equipment Utilized During Treatment Gait belt    Activity Tolerance Patient tolerated treatment well    Behavior During Therapy WFL for tasks assessed/performed                Past Medical History:  Diagnosis Date   Anxiety    BRCA gene mutation negative    Chronic constipation    GERD (gastroesophageal reflux disease)    Headache(784.0)    Hypertension    PONV (postoperative nausea and vomiting)    Wears contact lenses    Past Surgical History:  Procedure Laterality Date   ANAL FISTULECTOMY  11/2021   Dr. Vida Gravely at [facility]   BREAST EXCISIONAL BIOPSY Right 06-26-1999    dr Laraine Plate  @[facility]    CHOLECYSTECTOMY N/A 02/22/2018   Procedure: LAPAROSCOPIC CHOLECYSTECTOMY WITH INTRAOPERATIVE CHOLANGIOGRAM ERAS PATHWAY;  Surgeon: Jacolyn Matar, MD;  Location: [facility];  Service: General;  Laterality: N/A;   D & C HYSTEROSCOPY WITH RESECTION POLYPS  10-10-2009    dr rivard  @[facility]    DILATION AND CURETTAGE OF UTERUS  2006   WISDOM TOOTH EXTRACTION     Patient Active Problem List   Diagnosis Date Noted   Family history of breast cancer 06/09/2022   Normocytic normochromic anemia 06/17/2021   Thrombocytopenia (HCC) 06/17/2021   Age-related osteoporosis without current pathological fracture 05/05/2021   History of recurrent UTIs 12/29/2018   Status post laparoscopic cholecystectomy Jan 2020 02/22/2018   Menopause 07/17/2011   Essential hypertension 01/05/2011    ONSET DATE: 3 years  REFERRING  DIAG: R29.818 Difficulty Balancing  THERAPY DIAG:  Muscle weakness (generalized)  Difficulty in walking, not elsewhere classified  Rationale for Evaluation and Treatment: Rehabilitation  SUBJECTIVE:                                                                                                                                                                                             SUBJECTIVE STATEMENT: Exercises going well.  Ordering the foam cushion and don't have that yet.  Somewhat dizzy today,  don't know why.  Pt accompanied by: self  PERTINENT HISTORY: anxiety, GERD, HA, HTN, per pt- osteoporosis and essential tremors  PAIN:  Are you having pain? Yes: NPRS scale: 4/10 Pain location: LB Pain description: "sore" Aggravating factors: bending, prolonged sitting Relieving factors: sitting up straight when sitting  PRECAUTIONS: Fall  RED FLAGS: None   WEIGHT BEARING RESTRICTIONS: No  FALLS: Has patient fallen in last 6 months? No  LIVING ENVIRONMENT: Lives with: lives with their spouse and lives with their son (son requires physical assistance d/t disability) Lives in: House/apartment Stairs: 3-4 steps to enter; 1 story Has following equipment at home: None  PLOF: Independent and retired; likes walking, going to church, working in the yard   PATIENT GOALS: improve strength and balance   OBJECTIVE:     TODAY'S TREATMENT: 07/14/2023 Activity Comments      Forward/back walking Forward marching Tandem walk forward-cues for visual target 1-2 min each for warm-up at counter-intermittent UE support  Standing on Airex:  forward/back step and weightshift x 10 Side step and weightshift x 10 Back step and weightshift x 10 Initial UE support>no UE support  Review of heel/toe raises and ant/posterior hip strategy Cues for use of hip strategy  Wall bumps-EO and EC for hip/shoulder motion to utilize hip strategy 5-10 reps each  Sit to stand with cues for TKE, standing  minisquats to tall posture for TKE                PATIENT EDUCATION: Education details: Rationale for exercises to address hip/ankle/step strategy for balance recovery; use of visual targets for improved balance Person educated: Patient Education method: Explanation, Demonstration, Tactile cues, Verbal cues, and Handouts Education comprehension: verbalized understanding and returned demonstration    HOME EXERCISE PROGRAM Access Code: [email or web address] URL: [email or web address] Date: 07/06/2023 Prepared by: [facility] - Outpatient  Rehab - [facility]  Exercises - Squat with Chair Touch  - 1 x daily - 5 x weekly - 2 sets - 10 reps - Standing Alternating Knee Flexion with Ankle Weights  - 1 x daily - 5 x weekly - 2 sets - 10 reps - Standing March with Counter Support  - 1 x daily - 5 x weekly - 2 sets - 20 reps - Heel Raises with Counter Support  - 1 x daily - 5 x weekly - 2 sets - 10 reps - Side Stepping with Resistance at Thighs and Counter Support  - 1 x daily - 5 x weekly - 2 sets - 30 sec hold - Standing Weight Shifting Forward and Backward  - 1 x daily - 5 x weekly - 2 sets - 10 reps - Forward Backward Weight Shift with Counter Support  - 1 x daily - 5 x weekly - 2 sets - 10 reps    Note: Objective measures were completed at Evaluation unless otherwise noted.  DIAGNOSTIC FINDINGS: none recent  COGNITION: Overall cognitive status: Within functional limits for tasks assessed   SENSATION: Pt reports tingling in B feet  COORDINATION: Alternating pronation/supination: WNL Alternating toe tap: WNL Finger to nose: WFL, with tremor  POSTURE: forward head  LOWER EXTREMITY ROM:     Active  Right Eval Left Eval  Hip flexion    Hip extension    Hip abduction    Hip adduction    Hip internal rotation    Hip external rotation    Knee flexion    Knee extension    Ankle dorsiflexion 9 13  Ankle plantarflexion  Ankle inversion    Ankle eversion      (Blank rows = not tested)  LOWER EXTREMITY MMT:    MMT (in sitting) Right Eval Left Eval  Hip flexion 4+ 4  Hip extension    Hip abduction 4+ 4+  Hip adduction 4 4  Hip internal rotation    Hip external rotation    Knee flexion 4+ 4  Knee extension 4 4+  Ankle dorsiflexion 4 4  Ankle plantarflexion 4 4  Ankle inversion    Ankle eversion    (Blank rows = not tested)    GAIT: Findings: Assistive device utilized:None, Level of assistance: Complete Independence, and Comments: slight B toe out and very slight steppage gait   FUNCTIONAL TESTS:    M-CTSIB  Condition 1: Firm Surface, EO 30 Sec, Normal Sway  Condition 2: Firm Surface, EC 30 Sec, Normal Sway  Condition 3: Foam Surface, EO 30 Sec, Normal Sway  Condition 4: Foam Surface, EC 30 Sec, Mild Sway    [facility] PT Assessment - 06/23/23 0001       Standardized Balance Assessment   Standardized Balance Assessment Five Times Sit to Stand;10 meter walk test    Five times sit to stand comments  16.3 sec without UEs   slightly limited eccentric lower   10 Meter Walk 11.02 sec      Functional Gait  Assessment   Gait assessed  Yes    Gait Level Surface Walks 20 ft in less than 5.5 sec, no assistive devices, good speed, no evidence for imbalance, normal gait pattern, deviates no more than 6 in outside of the 12 in walkway width.    Change in Gait Speed Able to smoothly change walking speed without loss of balance or gait deviation. Deviate no more than 6 in outside of the 12 in walkway width.    Gait with Horizontal Head Turns Performs head turns smoothly with slight change in gait velocity (eg, minor disruption to smooth gait path), deviates 6-10 in outside 12 in walkway width, or uses an assistive device.    Gait with Vertical Head Turns Performs task with slight change in gait velocity (eg, minor disruption to smooth gait path), deviates 6 - 10 in outside 12 in walkway width or uses assistive device    Gait and Pivot Turn Pivot  turns safely in greater than 3 sec and stops with no loss of balance, or pivot turns safely within 3 sec and stops with mild imbalance, requires small steps to catch balance.    Step Over Obstacle Is able to step over 2 stacked shoe boxes taped together (9 in total height) without changing gait speed. No evidence of imbalance.    Gait with Narrow Base of Support Is able to ambulate for 10 steps heel to toe with no staggering.    Gait with Eyes Closed Walks 20 ft, no assistive devices, good speed, no evidence of imbalance, normal gait pattern, deviates no more than 6 in outside 12 in walkway width. Ambulates 20 ft in less than 7 sec.    Ambulating Backwards Walks 20 ft, no assistive devices, good speed, no evidence for imbalance, normal gait    Steps Alternating feet, no rail.    Total Score 27  TREATMENT DATE: 06/23/23    PATIENT EDUCATION: Education details: prognosis, POC, exam findings, edu on low risk of falls but goal of improving balance confidence, edu on fitness regimen to continue moving forward Person educated: Patient Education method: Explanation, Demonstration, Tactile cues, Verbal cues, and Handouts Education comprehension: verbalized understanding and returned demonstration  HOME EXERCISE PROGRAM: Not initiated   GOALS: Goals reviewed with patient? Yes  SHORT TERM GOALS: Target date: 07/14/2023  Patient to be independent with initial HEP. Baseline: HEP initiated Goal status: MET    LONG TERM GOALS: Target date: 08/04/2023  Patient to be independent with advanced HEP. Baseline: Not yet initiated  Goal status: IN PROGRESS  Patient to demonstrate B LE strength >/=4+/5.  Baseline: See above Goal status: IN PROGRESS  Patient to improve ABC score by at least 10 points.  Baseline: 77.8% 06/29/23 Goal status: IN PROGRESS  06/29/23  Patient to report plans to participate in strength training regimen several times a week to maintain PT gains.   Baseline: not started  Goal status: IN PROGRESS  ASSESSMENT:  CLINICAL IMPRESSION: Pt presents today with no new complaints. Skilled PT session focused on balance strategy work.  She has a tendency to brace with knee flexion versus using adequate hip strategy.  Worked on wall bumps as well as step strategy work on complaint surfaces.  She requires cues for optimal technique. Pt will continue to benefit from skilled PT towards goals for improved functional mobility and decreased fall risk.  No complaints at end of session.  OBJECTIVE IMPAIRMENTS: Abnormal gait, decreased balance, decreased ROM, decreased strength, impaired flexibility, and postural dysfunction.   ACTIVITY LIMITATIONS: carrying, lifting, and locomotion level  PARTICIPATION LIMITATIONS: community activity and yard work  PERSONAL FACTORS: Age, Past/current experiences, and 3+ comorbidities: anxiety, GERD, HA, HTN, per pt- osteoporosis and essential tremors are also affecting patient's functional outcome.   REHAB POTENTIAL: Good  CLINICAL DECISION MAKING: Evolving/moderate complexity  EVALUATION COMPLEXITY: Moderate  PLAN:  PT FREQUENCY: 1x/week  PT DURATION: 6 weeks  PLANNED INTERVENTIONS: 97164- PT Re-evaluation, 97750- Physical Performance Testing, 97110-Therapeutic exercises, 97530- Therapeutic activity, 97112- Neuromuscular re-education, 97535- Self Care, 40981- Manual therapy, [phone]- Gait training, [phone]- Canalith repositioning, Patient/Family education, Balance training, Stair training, Taping, Dry Needling, Vestibular training, Cryotherapy, and Moist heat  PLAN FOR NEXT SESSION: Continue BLE and UE strengthening while being mindful of LB and R knee pain; terminal knee extension, high level balance challenges with goal of building balance confidence     Dessie Flow, PT 07/15/23 2:53  PM Phone: [phone] Fax: [phone]  [facility] at [facility] [address], [address] [place], [place] [address] Phone # [phone] Fax # [phone]

## 2023-07-19 NOTE — Therapy (Signed)
 OUTPATIENT PHYSICAL THERAPY NEURO TREATMENT   Patient Name: Kayla Guerra MRN: [id number] DOB:20-Mar-1955, 68 y.o., female Today's Date: 07/20/2023   PCP: Bertha Broad, MD  REFERRING PROVIDER: Dinah Franco, MD  END OF SESSION:  PT End of Session - 07/20/23 1058     Visit Number 5    Number of Visits 7    Date for PT Re-Evaluation 08/04/23    Authorization Type BCBS Medicare    PT Start Time 1020    PT Stop Time 1058    PT Time Calculation (min) 38 min    Activity Tolerance Patient tolerated treatment well    Behavior During Therapy WFL for tasks assessed/performed                 Past Medical History:  Diagnosis Date   Anxiety    BRCA gene mutation negative    Chronic constipation    GERD (gastroesophageal reflux disease)    Headache(784.0)    Hypertension    PONV (postoperative nausea and vomiting)    Wears contact lenses    Past Surgical History:  Procedure Laterality Date   ANAL FISTULECTOMY  11/2021   Dr. Vida Gravely at [facility]   BREAST EXCISIONAL BIOPSY Right 06-26-1999    dr Laraine Plate  @[facility]    CHOLECYSTECTOMY N/A 02/22/2018   Procedure: LAPAROSCOPIC CHOLECYSTECTOMY WITH INTRAOPERATIVE CHOLANGIOGRAM ERAS PATHWAY;  Surgeon: Jacolyn Matar, MD;  Location: [facility];  Service: General;  Laterality: N/A;   D & C HYSTEROSCOPY WITH RESECTION POLYPS  10-10-2009    dr rivard  @[facility]    DILATION AND CURETTAGE OF UTERUS  2006   WISDOM TOOTH EXTRACTION     Patient Active Problem List   Diagnosis Date Noted   Family history of breast cancer 06/09/2022   Normocytic normochromic anemia 06/17/2021   Thrombocytopenia (HCC) 06/17/2021   Age-related osteoporosis without current pathological fracture 05/05/2021   History of recurrent UTIs 12/29/2018   Status post laparoscopic cholecystectomy Jan 2020 02/22/2018   Menopause 07/17/2011   Essential hypertension 01/05/2011    ONSET DATE: 3 years  REFERRING DIAG: R29.818 Difficulty Balancing  THERAPY  DIAG:  Muscle weakness (generalized)  Difficulty in walking, not elsewhere classified  Rationale for Evaluation and Treatment: Rehabilitation  SUBJECTIVE:                                                                                                                                                                                             SUBJECTIVE STATEMENT: Doing good. Got a crick in my shoulder from sleeping wrong.   Pt accompanied by: self  PERTINENT HISTORY: anxiety, GERD, HA, HTN, per  pt- osteoporosis and essential tremors  PAIN:  Are you having pain? Yes: NPRS scale: mild/10 Pain location: R neck/shoulder Pain description: "crick" Aggravating factors: bending, prolonged sitting Relieving factors: sitting up straight when sitting  PRECAUTIONS: Fall  RED FLAGS: None   WEIGHT BEARING RESTRICTIONS: No  FALLS: Has patient fallen in last 6 months? No  LIVING ENVIRONMENT: Lives with: lives with their spouse and lives with their son (son requires physical assistance d/t disability) Lives in: House/apartment Stairs: 3-4 steps to enter; 1 story Has following equipment at home: None  PLOF: Independent and retired; likes walking, going to church, working in the yard   PATIENT GOALS: improve strength and balance   OBJECTIVE:      TODAY'S TREATMENT: 07/20/23 Activity Comments  on airex cushion: 1/2 kneel red TB rows 10x 1/2 kneel red TB straight arm pulldowns 10x tall kneeling paloff press with red TB 10x each  Cueing for proper foot placement for max stability and comfort; cues for form . Edu on modifications to reduce LB tension   Prayer stretch with pball 10x; forward and R/L diagonals  To relieve LB tension      HOME EXERCISE PROGRAM Access Code: [email or web address] URL: [email or web address] Date: 07/20/2023 Prepared by: [facility] - Outpatient  Rehab - [facility]  Exercises - Squat with Chair Touch  - 1 x daily - 5 x weekly - 2 sets - 10 reps -  Standing Alternating Knee Flexion with Ankle Weights  - 1 x daily - 5 x weekly - 2 sets - 10 reps - Standing March with Counter Support  - 1 x daily - 5 x weekly - 2 sets - 20 reps - Heel Raises with Counter Support  - 1 x daily - 5 x weekly - 2 sets - 10 reps - Side Stepping with Resistance at Thighs and Counter Support  - 1 x daily - 5 x weekly - 2 sets - 30 sec hold - Standing Weight Shifting Forward and Backward  - 1 x daily - 5 x weekly - 2 sets - 10 reps - Forward Backward Weight Shift with Counter Support  - 1 x daily - 5 x weekly - 2 sets - 10 reps - Half-Kneeling Shoulder Extension with Resistance  - 1 x daily - 5 x weekly - 2 sets - 10 reps - Half Kneel Row  - 1 x daily - 5 x weekly - 2 sets - 10 reps    PATIENT EDUCATION: Education details: HEP update with edu on how to modify exercises to reduce LB tension; answered pt's questions about transitioning to gym at some time Person educated: Patient  Education method: Explanation, Demonstration, Tactile cues, Verbal cues, and Handouts Education comprehension: verbalized understanding and returned demonstration    Note: Objective measures were completed at Evaluation unless otherwise noted.  DIAGNOSTIC FINDINGS: none recent  COGNITION: Overall cognitive status: Within functional limits for tasks assessed   SENSATION: Pt reports tingling in B feet  COORDINATION: Alternating pronation/supination: WNL Alternating toe tap: WNL Finger to nose: WFL, with tremor  POSTURE: forward head  LOWER EXTREMITY ROM:     Active  Right Eval Left Eval  Hip flexion    Hip extension    Hip abduction    Hip adduction    Hip internal rotation    Hip external rotation    Knee flexion    Knee extension    Ankle dorsiflexion 9 13  Ankle plantarflexion    Ankle inversion    Ankle  eversion     (Blank rows = not tested)  LOWER EXTREMITY MMT:    MMT (in sitting) Right Eval Left Eval  Hip flexion 4+ 4  Hip extension    Hip  abduction 4+ 4+  Hip adduction 4 4  Hip internal rotation    Hip external rotation    Knee flexion 4+ 4  Knee extension 4 4+  Ankle dorsiflexion 4 4  Ankle plantarflexion 4 4  Ankle inversion    Ankle eversion    (Blank rows = not tested)    GAIT: Findings: Assistive device utilized:None, Level of assistance: Complete Independence, and Comments: slight B toe out and very slight steppage gait   FUNCTIONAL TESTS:    M-CTSIB  Condition 1: Firm Surface, EO 30 Sec, Normal Sway  Condition 2: Firm Surface, EC 30 Sec, Normal Sway  Condition 3: Foam Surface, EO 30 Sec, Normal Sway  Condition 4: Foam Surface, EC 30 Sec, Mild Sway    [facility] PT Assessment - 06/23/23 0001       Standardized Balance Assessment   Standardized Balance Assessment Five Times Sit to Stand;10 meter walk test    Five times sit to stand comments  16.3 sec without UEs   slightly limited eccentric lower   10 Meter Walk 11.02 sec      Functional Gait  Assessment   Gait assessed  Yes    Gait Level Surface Walks 20 ft in less than 5.5 sec, no assistive devices, good speed, no evidence for imbalance, normal gait pattern, deviates no more than 6 in outside of the 12 in walkway width.    Change in Gait Speed Able to smoothly change walking speed without loss of balance or gait deviation. Deviate no more than 6 in outside of the 12 in walkway width.    Gait with Horizontal Head Turns Performs head turns smoothly with slight change in gait velocity (eg, minor disruption to smooth gait path), deviates 6-10 in outside 12 in walkway width, or uses an assistive device.    Gait with Vertical Head Turns Performs task with slight change in gait velocity (eg, minor disruption to smooth gait path), deviates 6 - 10 in outside 12 in walkway width or uses assistive device    Gait and Pivot Turn Pivot turns safely in greater than 3 sec and stops with no loss of balance, or pivot turns safely within 3 sec and stops with mild imbalance,  requires small steps to catch balance.    Step Over Obstacle Is able to step over 2 stacked shoe boxes taped together (9 in total height) without changing gait speed. No evidence of imbalance.    Gait with Narrow Base of Support Is able to ambulate for 10 steps heel to toe with no staggering.    Gait with Eyes Closed Walks 20 ft, no assistive devices, good speed, no evidence of imbalance, normal gait pattern, deviates no more than 6 in outside 12 in walkway width. Ambulates 20 ft in less than 7 sec.    Ambulating Backwards Walks 20 ft, no assistive devices, good speed, no evidence for imbalance, normal gait    Steps Alternating feet, no rail.    Total Score 27  TREATMENT DATE: 06/23/23    PATIENT EDUCATION: Education details: prognosis, POC, exam findings, edu on low risk of falls but goal of improving balance confidence, edu on fitness regimen to continue moving forward Person educated: Patient Education method: Explanation, Demonstration, Tactile cues, Verbal cues, and Handouts Education comprehension: verbalized understanding and returned demonstration  HOME EXERCISE PROGRAM: Not initiated   GOALS: Goals reviewed with patient? Yes  SHORT TERM GOALS: Target date: 07/14/2023  Patient to be independent with initial HEP. Baseline: HEP initiated Goal status: MET    LONG TERM GOALS: Target date: 08/04/2023  Patient to be independent with advanced HEP. Baseline: Not yet initiated  Goal status: IN PROGRESS  Patient to demonstrate B LE strength >/=4+/5.  Baseline: See above Goal status: IN PROGRESS  Patient to improve ABC score by at least 10 points.  Baseline: 77.8% 06/29/23 Goal status: IN PROGRESS 06/29/23  Patient to report plans to participate in strength training regimen several times a week to maintain PT gains.   Baseline: not  started  Goal status: IN PROGRESS  ASSESSMENT:  CLINICAL IMPRESSION: Patient arrived to session with report of some R shoulder pain from sleeping. Initiated core strengthening in tall kneeling and  kneeling to encouraged increased activation of glutes. Patient required cues for proper form and positioning and edu on adjusting exercises to reduce LB strain. These exercises were tolerated well and were updated into HEP. No complaints upon leaving.   OBJECTIVE IMPAIRMENTS: Abnormal gait, decreased balance, decreased ROM, decreased strength, impaired flexibility, and postural dysfunction.   ACTIVITY LIMITATIONS: carrying, lifting, and locomotion level  PARTICIPATION LIMITATIONS: community activity and yard work  PERSONAL FACTORS: Age, Past/current experiences, and 3+ comorbidities: anxiety, GERD, HA, HTN, per pt- osteoporosis and essential tremors are also affecting patient's functional outcome.   REHAB POTENTIAL: Good  CLINICAL DECISION MAKING: Evolving/moderate complexity  EVALUATION COMPLEXITY: Moderate  PLAN:  PT FREQUENCY: 1x/week  PT DURATION: 6 weeks  PLANNED INTERVENTIONS: 97164- PT Re-evaluation, 97750- Physical Performance Testing, 97110-Therapeutic exercises, 97530- Therapeutic activity, 97112- Neuromuscular re-education, 97535- Self Care, 84696- Manual therapy, [phone]- Gait training, [phone]- Canalith repositioning, Patient/Family education, Balance training, Stair training, Taping, Dry Needling, Vestibular training, Cryotherapy, and Moist heat  PLAN FOR NEXT SESSION: Continue BLE and UE strengthening while being mindful of LB and R knee pain; terminal knee extension, high level balance challenges with goal of building balance confidence     Thaddeus Filippo, PT, DPT 07/20/23 11:00 AM  [facility] [address] [place], [place] [address] Phone # [phone] Fax # [phone]

## 2023-07-20 ENCOUNTER — Ambulatory Visit: Attending: Neurology | Admitting: Physical Therapy

## 2023-07-20 ENCOUNTER — Encounter: Payer: Self-pay | Admitting: Physical Therapy

## 2023-07-20 DIAGNOSIS — R262 Difficulty in walking, not elsewhere classified: Secondary | ICD-10-CM | POA: Diagnosis not present

## 2023-07-20 DIAGNOSIS — M6281 Muscle weakness (generalized): Secondary | ICD-10-CM | POA: Insufficient documentation

## 2023-07-26 NOTE — Therapy (Signed)
 OUTPATIENT PHYSICAL THERAPY NEURO TREATMENT   Patient Name: Kayla Guerra MRN: [id number] DOB:07-28-1955, 68 y.o., female Today's Date: 07/26/2023   PCP: Bertha Broad, MD  REFERRING PROVIDER: Dinah Franco, MD  END OF SESSION:        Past Medical History:  Diagnosis Date   Anxiety    BRCA gene mutation negative    Chronic constipation    GERD (gastroesophageal reflux disease)    Headache(784.0)    Hypertension    PONV (postoperative nausea and vomiting)    Wears contact lenses    Past Surgical History:  Procedure Laterality Date   ANAL FISTULECTOMY  11/2021   Dr. Vida Gravely at [facility]   BREAST EXCISIONAL BIOPSY Right 06-26-1999    dr Laraine Plate  @[facility]    CHOLECYSTECTOMY N/A 02/22/2018   Procedure: LAPAROSCOPIC CHOLECYSTECTOMY WITH INTRAOPERATIVE CHOLANGIOGRAM ERAS PATHWAY;  Surgeon: Jacolyn Matar, MD;  Location: [facility];  Service: General;  Laterality: N/A;   D & C HYSTEROSCOPY WITH RESECTION POLYPS  10-10-2009    dr rivard  @[facility]    DILATION AND CURETTAGE OF UTERUS  2006   WISDOM TOOTH EXTRACTION     Patient Active Problem List   Diagnosis Date Noted   Family history of breast cancer 06/09/2022   Normocytic normochromic anemia 06/17/2021   Thrombocytopenia (HCC) 06/17/2021   Age-related osteoporosis without current pathological fracture 05/05/2021   History of recurrent UTIs 12/29/2018   Status post laparoscopic cholecystectomy Jan 2020 02/22/2018   Menopause 07/17/2011   Essential hypertension 01/05/2011    ONSET DATE: 3 years  REFERRING DIAG: R29.818 Difficulty Balancing  THERAPY DIAG:  No diagnosis found.  Rationale for Evaluation and Treatment: Rehabilitation  SUBJECTIVE:                                                                                                                                                                                             SUBJECTIVE STATEMENT: Doing good. Got a crick in my shoulder from sleeping  wrong.   Pt accompanied by: self  PERTINENT HISTORY: anxiety, GERD, HA, HTN, per pt- osteoporosis and essential tremors  PAIN:  Are you having pain? Yes: NPRS scale: mild/10 Pain location: R neck/shoulder Pain description: "crick" Aggravating factors: bending, prolonged sitting Relieving factors: sitting up straight when sitting  PRECAUTIONS: Fall  RED FLAGS: None   WEIGHT BEARING RESTRICTIONS: No  FALLS: Has patient fallen in last 6 months? No  LIVING ENVIRONMENT: Lives with: lives with their spouse and lives with their son (son requires physical assistance d/t disability) Lives in: House/apartment Stairs: 3-4 steps to enter; 1 story Has following equipment at home: None  PLOF: Independent and retired; likes walking, going to church, working in the yard   PATIENT GOALS: improve strength and balance   OBJECTIVE:     TODAY'S TREATMENT: 07/27/23 Activity Comments                        TODAY'S TREATMENT: 07/20/23 Activity Comments  on airex cushion: 1/2 kneel red TB rows 10x 1/2 kneel red TB straight arm pulldowns 10x tall kneeling paloff press with red TB 10x each  Cueing for proper foot placement for max stability and comfort; cues for form . Edu on modifications to reduce LB tension   Prayer stretch with pball 10x; forward and R/L diagonals  To relieve LB tension      HOME EXERCISE PROGRAM Access Code: [email or web address] URL: [email or web address] Date: 07/20/2023 Prepared by: [facility] - Outpatient  Rehab - [facility]  Exercises - Squat with Chair Touch  - 1 x daily - 5 x weekly - 2 sets - 10 reps - Standing Alternating Knee Flexion with Ankle Weights  - 1 x daily - 5 x weekly - 2 sets - 10 reps - Standing March with Counter Support  - 1 x daily - 5 x weekly - 2 sets - 20 reps - Heel Raises with Counter Support  - 1 x daily - 5 x weekly - 2 sets - 10 reps - Side Stepping with Resistance at Thighs and Counter Support  - 1 x daily - 5 x  weekly - 2 sets - 30 sec hold - Standing Weight Shifting Forward and Backward  - 1 x daily - 5 x weekly - 2 sets - 10 reps - Forward Backward Weight Shift with Counter Support  - 1 x daily - 5 x weekly - 2 sets - 10 reps - Half-Kneeling Shoulder Extension with Resistance  - 1 x daily - 5 x weekly - 2 sets - 10 reps - Half Kneel Row  - 1 x daily - 5 x weekly - 2 sets - 10 reps    PATIENT EDUCATION: Education details: HEP update with edu on how to modify exercises to reduce LB tension; answered pt's questions about transitioning to gym at some time Person educated: Patient  Education method: Explanation, Demonstration, Tactile cues, Verbal cues, and Handouts Education comprehension: verbalized understanding and returned demonstration    Note: Objective measures were completed at Evaluation unless otherwise noted.  DIAGNOSTIC FINDINGS: none recent  COGNITION: Overall cognitive status: Within functional limits for tasks assessed   SENSATION: Pt reports tingling in B feet  COORDINATION: Alternating pronation/supination: WNL Alternating toe tap: WNL Finger to nose: WFL, with tremor  POSTURE: forward head  LOWER EXTREMITY ROM:     Active  Right Eval Left Eval  Hip flexion    Hip extension    Hip abduction    Hip adduction    Hip internal rotation    Hip external rotation    Knee flexion    Knee extension    Ankle dorsiflexion 9 13  Ankle plantarflexion    Ankle inversion    Ankle eversion     (Blank rows = not tested)  LOWER EXTREMITY MMT:    MMT (in sitting) Right Eval Left Eval  Hip flexion 4+ 4  Hip extension    Hip abduction 4+ 4+  Hip adduction 4 4  Hip internal rotation    Hip external rotation    Knee flexion 4+ 4  Knee extension 4 4+  Ankle  dorsiflexion 4 4  Ankle plantarflexion 4 4  Ankle inversion    Ankle eversion    (Blank rows = not tested)    GAIT: Findings: Assistive device utilized:None, Level of assistance: Complete Independence, and  Comments: slight B toe out and very slight steppage gait   FUNCTIONAL TESTS:    M-CTSIB  Condition 1: Firm Surface, EO 30 Sec, Normal Sway  Condition 2: Firm Surface, EC 30 Sec, Normal Sway  Condition 3: Foam Surface, EO 30 Sec, Normal Sway  Condition 4: Foam Surface, EC 30 Sec, Mild Sway    [facility] PT Assessment - 06/23/23 0001       Standardized Balance Assessment   Standardized Balance Assessment Five Times Sit to Stand;10 meter walk test    Five times sit to stand comments  16.3 sec without UEs   slightly limited eccentric lower   10 Meter Walk 11.02 sec      Functional Gait  Assessment   Gait assessed  Yes    Gait Level Surface Walks 20 ft in less than 5.5 sec, no assistive devices, good speed, no evidence for imbalance, normal gait pattern, deviates no more than 6 in outside of the 12 in walkway width.    Change in Gait Speed Able to smoothly change walking speed without loss of balance or gait deviation. Deviate no more than 6 in outside of the 12 in walkway width.    Gait with Horizontal Head Turns Performs head turns smoothly with slight change in gait velocity (eg, minor disruption to smooth gait path), deviates 6-10 in outside 12 in walkway width, or uses an assistive device.    Gait with Vertical Head Turns Performs task with slight change in gait velocity (eg, minor disruption to smooth gait path), deviates 6 - 10 in outside 12 in walkway width or uses assistive device    Gait and Pivot Turn Pivot turns safely in greater than 3 sec and stops with no loss of balance, or pivot turns safely within 3 sec and stops with mild imbalance, requires small steps to catch balance.    Step Over Obstacle Is able to step over 2 stacked shoe boxes taped together (9 in total height) without changing gait speed. No evidence of imbalance.    Gait with Narrow Base of Support Is able to ambulate for 10 steps heel to toe with no staggering.    Gait with Eyes Closed Walks 20 ft, no assistive devices,  good speed, no evidence of imbalance, normal gait pattern, deviates no more than 6 in outside 12 in walkway width. Ambulates 20 ft in less than 7 sec.    Ambulating Backwards Walks 20 ft, no assistive devices, good speed, no evidence for imbalance, normal gait    Steps Alternating feet, no rail.    Total Score 27  TREATMENT DATE: 06/23/23    PATIENT EDUCATION: Education details: prognosis, POC, exam findings, edu on low risk of falls but goal of improving balance confidence, edu on fitness regimen to continue moving forward Person educated: Patient Education method: Explanation, Demonstration, Tactile cues, Verbal cues, and Handouts Education comprehension: verbalized understanding and returned demonstration  HOME EXERCISE PROGRAM: Not initiated   GOALS: Goals reviewed with patient? Yes  SHORT TERM GOALS: Target date: 07/14/2023  Patient to be independent with initial HEP. Baseline: HEP initiated Goal status: MET    LONG TERM GOALS: Target date: 08/04/2023  Patient to be independent with advanced HEP. Baseline: Not yet initiated  Goal status: IN PROGRESS  Patient to demonstrate B LE strength >/=4+/5.  Baseline: See above Goal status: IN PROGRESS  Patient to improve ABC score by at least 10 points.  Baseline: 77.8% 06/29/23 Goal status: IN PROGRESS 06/29/23  Patient to report plans to participate in strength training regimen several times a week to maintain PT gains.   Baseline: not started  Goal status: IN PROGRESS  ASSESSMENT:  CLINICAL IMPRESSION: Patient arrived to session with report of some R shoulder pain from sleeping. Initiated core strengthening in tall kneeling and  kneeling to encouraged increased activation of glutes. Patient required cues for proper form and positioning and edu on adjusting exercises to reduce LB  strain. These exercises were tolerated well and were updated into HEP. No complaints upon leaving.   OBJECTIVE IMPAIRMENTS: Abnormal gait, decreased balance, decreased ROM, decreased strength, impaired flexibility, and postural dysfunction.   ACTIVITY LIMITATIONS: carrying, lifting, and locomotion level  PARTICIPATION LIMITATIONS: community activity and yard work  PERSONAL FACTORS: Age, Past/current experiences, and 3+ comorbidities: anxiety, GERD, HA, HTN, per pt- osteoporosis and essential tremors are also affecting patient's functional outcome.   REHAB POTENTIAL: Good  CLINICAL DECISION MAKING: Evolving/moderate complexity  EVALUATION COMPLEXITY: Moderate  PLAN:  PT FREQUENCY: 1x/week  PT DURATION: 6 weeks  PLANNED INTERVENTIONS: 97164- PT Re-evaluation, 97750- Physical Performance Testing, 97110-Therapeutic exercises, 97530- Therapeutic activity, 97112- Neuromuscular re-education, 97535- Self Care, 52841- Manual therapy, [phone]- Gait training, [phone]- Canalith repositioning, Patient/Family education, Balance training, Stair training, Taping, Dry Needling, Vestibular training, Cryotherapy, and Moist heat  PLAN FOR NEXT SESSION: Continue BLE and UE strengthening while being mindful of LB and R knee pain; terminal knee extension, high level balance challenges with goal of building balance confidence     Thaddeus Filippo, PT, DPT 07/26/23 12:14 PM  [facility] [address], [address] [place], [place] [address] Phone # [phone] Fax # [phone]

## 2023-07-27 ENCOUNTER — Encounter: Payer: Self-pay | Admitting: Physical Therapy

## 2023-07-27 ENCOUNTER — Ambulatory Visit: Admitting: Physical Therapy

## 2023-07-27 DIAGNOSIS — M6281 Muscle weakness (generalized): Secondary | ICD-10-CM | POA: Diagnosis not present

## 2023-07-27 DIAGNOSIS — R262 Difficulty in walking, not elsewhere classified: Secondary | ICD-10-CM | POA: Diagnosis not present

## 2023-08-02 DIAGNOSIS — E785 Hyperlipidemia, unspecified: Secondary | ICD-10-CM | POA: Diagnosis not present

## 2023-08-02 DIAGNOSIS — Z1212 Encounter for screening for malignant neoplasm of rectum: Secondary | ICD-10-CM | POA: Diagnosis not present

## 2023-08-02 DIAGNOSIS — I10 Essential (primary) hypertension: Secondary | ICD-10-CM | POA: Diagnosis not present

## 2023-08-02 DIAGNOSIS — D649 Anemia, unspecified: Secondary | ICD-10-CM | POA: Diagnosis not present

## 2023-08-02 DIAGNOSIS — M8580 Other specified disorders of bone density and structure, unspecified site: Secondary | ICD-10-CM | POA: Diagnosis not present

## 2023-08-02 NOTE — Therapy (Signed)
 OUTPATIENT PHYSICAL THERAPY NEURO TREATMENT   Patient Name: Kayla Guerra MRN: [id number] DOB:10-03-1955, 68 y.o., female Today's Date: 08/02/2023   PCP: Bertha Broad, MD  REFERRING PROVIDER: Dinah Franco, MD  END OF SESSION:         Past Medical History:  Diagnosis Date   Anxiety    BRCA gene mutation negative    Chronic constipation    GERD (gastroesophageal reflux disease)    Headache(784.0)    Hypertension    PONV (postoperative nausea and vomiting)    Wears contact lenses    Past Surgical History:  Procedure Laterality Date   ANAL FISTULECTOMY  11/2021   Dr. Vida [facility] at [facility]   BREAST EXCISIONAL BIOPSY Right 06-26-1999    dr Laraine Plate  @[facility]    CHOLECYSTECTOMY N/A 02/22/2018   Procedure: LAPAROSCOPIC CHOLECYSTECTOMY WITH INTRAOPERATIVE CHOLANGIOGRAM ERAS PATHWAY;  Surgeon: Jacolyn Matar, MD;  Location: [facility];  Service: General;  Laterality: N/A;   D & C HYSTEROSCOPY WITH RESECTION POLYPS  10-10-2009    dr rivard  @[facility]    DILATION AND CURETTAGE OF UTERUS  2006   WISDOM TOOTH EXTRACTION     Patient Active Problem List   Diagnosis Date Noted   Family history of breast cancer 06/09/2022   Normocytic normochromic anemia 06/17/2021   Thrombocytopenia (HCC) 06/17/2021   Age-related osteoporosis without current pathological fracture 05/05/2021   History of recurrent UTIs 12/29/2018   Status post laparoscopic cholecystectomy Jan 2020 02/22/2018   Menopause 07/17/2011   Essential hypertension 01/05/2011    ONSET DATE: 3 years  REFERRING DIAG: R29.818 Difficulty Balancing  THERAPY DIAG:  No diagnosis found.  Rationale for Evaluation and Treatment: Rehabilitation  SUBJECTIVE:                                                                                                                                                                                             SUBJECTIVE STATEMENT: Doing well. Nothing new. Reports that she tolerated  last session well.   Pt accompanied by: self  PERTINENT HISTORY: anxiety, GERD, HA, HTN, per pt- osteoporosis and essential tremors  PAIN:  Are you having pain? Yes: NPRS scale: a little bit/10 Pain location: LBP Pain description: ache Aggravating factors: bending, prolonged sitting Relieving factors: sitting up straight when sitting  PRECAUTIONS: Fall  RED FLAGS: None   WEIGHT BEARING RESTRICTIONS: No  FALLS: Has patient fallen in last 6 months? No  LIVING ENVIRONMENT: Lives with: lives with their spouse and lives with their son (son requires physical assistance d/t disability) Lives in: House/apartment Stairs: 3-4 steps to enter; 1 story Has following equipment at home:  None  PLOF: Independent and retired; likes walking, going to church, working in the yard   PATIENT GOALS: improve strength and balance   OBJECTIVE:    TODAY'S TREATMENT: 08/03/23 Activity Comments                        HOME EXERCISE PROGRAM Access Code: [email or web address] URL: [email or web address] Date: 07/27/2023 Prepared by: [facility] - Outpatient  Rehab - [facility]  Exercises - Squat with Chair Touch  - 1 x daily - 5 x weekly - 2 sets - 10 reps - Standing Alternating Knee Flexion with Ankle Weights  - 1 x daily - 5 x weekly - 2 sets - 10 reps - Heel Raises with Counter Support  - 1 x daily - 5 x weekly - 2 sets - 10 reps - Side Stepping with Resistance at Thighs and Counter Support  - 1 x daily - 5 x weekly - 2 sets - 30 sec hold - Forward Backward Weight Shift with Counter Support  - 1 x daily - 5 x weekly - 2 sets - 10 reps - Half-Kneeling Shoulder Extension with Resistance  - 1 x daily - 5 x weekly - 2 sets - 10 reps - Half Kneel Row  - 1 x daily - 5 x weekly - 2 sets - 10 reps - Quadruped Alternating Arm Lift  - 1 x daily - 5 x weekly - 2 sets - 10 reps      Note: Objective measures were completed at Evaluation unless otherwise noted.  DIAGNOSTIC FINDINGS:  none recent  COGNITION: Overall cognitive status: Within functional limits for tasks assessed   SENSATION: Pt reports tingling in B feet  COORDINATION: Alternating pronation/supination: WNL Alternating toe tap: WNL Finger to nose: WFL, with tremor  POSTURE: forward head  LOWER EXTREMITY ROM:     Active  Right Eval Left Eval  Hip flexion    Hip extension    Hip abduction    Hip adduction    Hip internal rotation    Hip external rotation    Knee flexion    Knee extension    Ankle dorsiflexion 9 13  Ankle plantarflexion    Ankle inversion    Ankle eversion     (Blank rows = not tested)  LOWER EXTREMITY MMT:    MMT (in sitting) Right Eval Left Eval  Hip flexion 4+ 4  Hip extension    Hip abduction 4+ 4+  Hip adduction 4 4  Hip internal rotation    Hip external rotation    Knee flexion 4+ 4  Knee extension 4 4+  Ankle dorsiflexion 4 4  Ankle plantarflexion 4 4  Ankle inversion    Ankle eversion    (Blank rows = not tested)    GAIT: Findings: Assistive device utilized:None, Level of assistance: Complete Independence, and Comments: slight B toe out and very slight steppage gait   FUNCTIONAL TESTS:    M-CTSIB  Condition 1: Firm Surface, EO 30 Sec, Normal Sway  Condition 2: Firm Surface, EC 30 Sec, Normal Sway  Condition 3: Foam Surface, EO 30 Sec, Normal Sway  Condition 4: Foam Surface, EC 30 Sec, Mild Sway    [facility] PT Assessment - 06/23/23 0001       Standardized Balance Assessment   Standardized Balance Assessment Five Times Sit to Stand;10 meter walk test    Five times sit to stand comments  16.3 sec without UEs   slightly limited eccentric  lower   10 Meter Walk 11.02 sec      Functional Gait  Assessment   Gait assessed  Yes    Gait Level Surface Walks 20 ft in less than 5.5 sec, no assistive devices, good speed, no evidence for imbalance, normal gait pattern, deviates no more than 6 in outside of the 12 in walkway width.    Change in Gait Speed  Able to smoothly change walking speed without loss of balance or gait deviation. Deviate no more than 6 in outside of the 12 in walkway width.    Gait with Horizontal Head Turns Performs head turns smoothly with slight change in gait velocity (eg, minor disruption to smooth gait path), deviates 6-10 in outside 12 in walkway width, or uses an assistive device.    Gait with Vertical Head Turns Performs task with slight change in gait velocity (eg, minor disruption to smooth gait path), deviates 6 - 10 in outside 12 in walkway width or uses assistive device    Gait and Pivot Turn Pivot turns safely in greater than 3 sec and stops with no loss of balance, or pivot turns safely within 3 sec and stops with mild imbalance, requires small steps to catch balance.    Step Over Obstacle Is able to step over 2 stacked shoe boxes taped together (9 in total height) without changing gait speed. No evidence of imbalance.    Gait with Narrow Base of Support Is able to ambulate for 10 steps heel to toe with no staggering.    Gait with Eyes Closed Walks 20 ft, no assistive devices, good speed, no evidence of imbalance, normal gait pattern, deviates no more than 6 in outside 12 in walkway width. Ambulates 20 ft in less than 7 sec.    Ambulating Backwards Walks 20 ft, no assistive devices, good speed, no evidence for imbalance, normal gait    Steps Alternating feet, no rail.    Total Score 27                                                                                                                                          TREATMENT DATE: 06/23/23    PATIENT EDUCATION: Education details: prognosis, POC, exam findings, edu on low risk of falls but goal of improving balance confidence, edu on fitness regimen to continue moving forward Person educated: Patient Education method: Explanation, Demonstration, Tactile cues, Verbal cues, and Handouts Education comprehension: verbalized understanding and returned  demonstration  HOME EXERCISE PROGRAM: Not initiated   GOALS: Goals reviewed with patient? Yes  SHORT TERM GOALS: Target date: 07/14/2023  Patient to be independent with initial HEP. Baseline: HEP initiated Goal status: MET    LONG TERM GOALS: Target date: 08/04/2023  Patient to be independent with advanced HEP. Baseline: Not yet initiated  Goal status: IN PROGRESS  Patient to demonstrate B LE strength >/=4+/5.  Baseline: See  above Goal status: IN PROGRESS  Patient to improve ABC score by at least 10 points.  Baseline: 77.8% 06/29/23 Goal status: IN PROGRESS 06/29/23  Patient to report plans to participate in strength training regimen several times a week to maintain PT gains.   Baseline: not started  Goal status: IN PROGRESS  ASSESSMENT:  CLINICAL IMPRESSION: Patient arrived to session without new complaints. Balance exercises today focused on SLS, head turns/nods, and use of compliant surface. Observed increased instability with head turns compared to nods today. Core instability evident with quadruped activities, thus will need increased practice with core stability work. Pt tolerated session well and without complaints upon leaving.   OBJECTIVE IMPAIRMENTS: Abnormal gait, decreased balance, decreased ROM, decreased strength, impaired flexibility, and postural dysfunction.   ACTIVITY LIMITATIONS: carrying, lifting, and locomotion level  PARTICIPATION LIMITATIONS: community activity and yard work  PERSONAL FACTORS: Age, Past/current experiences, and 3+ comorbidities: anxiety, GERD, HA, HTN, per pt- osteoporosis and essential tremors are also affecting patient's functional outcome.   REHAB POTENTIAL: Good  CLINICAL DECISION MAKING: Evolving/moderate complexity  EVALUATION COMPLEXITY: Moderate  PLAN:  PT FREQUENCY: 1x/week  PT DURATION: 6 weeks  PLANNED INTERVENTIONS: 97164- PT Re-evaluation, 97750- Physical Performance Testing, 97110-Therapeutic exercises,  97530- Therapeutic activity, 97112- Neuromuscular re-education, 97535- Self Care, 91478- Manual therapy, [phone]- Gait training, [phone]- Canalith repositioning, Patient/Family education, Balance training, Stair training, Taping, Dry Needling, Vestibular training, Cryotherapy, and Moist heat  PLAN FOR NEXT SESSION: Continue BLE and UE strengthening while being mindful of LB and R knee pain; terminal knee extension, high level balance challenges with goal of building balance confidence     Thaddeus Filippo, PT, DPT 08/02/23 8:08 AM  [facility] at [facility] [address], [address] [place], [place] [address] Phone # [phone] Fax # [phone]

## 2023-08-03 ENCOUNTER — Encounter: Payer: Self-pay | Admitting: Physical Therapy

## 2023-08-03 ENCOUNTER — Ambulatory Visit: Admitting: Physical Therapy

## 2023-08-03 DIAGNOSIS — M6281 Muscle weakness (generalized): Secondary | ICD-10-CM | POA: Diagnosis not present

## 2023-08-03 DIAGNOSIS — R262 Difficulty in walking, not elsewhere classified: Secondary | ICD-10-CM

## 2023-08-05 ENCOUNTER — Other Ambulatory Visit (HOSPITAL_BASED_OUTPATIENT_CLINIC_OR_DEPARTMENT_OTHER): Payer: Self-pay | Admitting: Obstetrics & Gynecology

## 2023-08-05 ENCOUNTER — Encounter (HOSPITAL_BASED_OUTPATIENT_CLINIC_OR_DEPARTMENT_OTHER): Payer: Self-pay | Admitting: Obstetrics & Gynecology

## 2023-08-05 DIAGNOSIS — Z803 Family history of malignant neoplasm of breast: Secondary | ICD-10-CM

## 2023-08-05 DIAGNOSIS — I10 Essential (primary) hypertension: Secondary | ICD-10-CM | POA: Diagnosis not present

## 2023-08-05 DIAGNOSIS — R82998 Other abnormal findings in urine: Secondary | ICD-10-CM | POA: Diagnosis not present

## 2023-08-05 DIAGNOSIS — Z9189 Other specified personal risk factors, not elsewhere classified: Secondary | ICD-10-CM

## 2023-08-06 ENCOUNTER — Ambulatory Visit (HOSPITAL_BASED_OUTPATIENT_CLINIC_OR_DEPARTMENT_OTHER): Payer: Self-pay | Admitting: Obstetrics & Gynecology

## 2023-08-06 ENCOUNTER — Ambulatory Visit
Admission: RE | Admit: 2023-08-06 | Discharge: 2023-08-06 | Disposition: A | Discharge status: Home / Self Care | Source: Ambulatory Visit | Attending: Obstetrics & Gynecology | Admitting: Obstetrics & Gynecology

## 2023-08-06 DIAGNOSIS — Z9189 Other specified personal risk factors, not elsewhere classified: Secondary | ICD-10-CM

## 2023-08-06 DIAGNOSIS — Z1239 Encounter for other screening for malignant neoplasm of breast: Secondary | ICD-10-CM | POA: Diagnosis not present

## 2023-08-06 DIAGNOSIS — Z803 Family history of malignant neoplasm of breast: Secondary | ICD-10-CM

## 2023-08-06 MED ORDER — GADOPICLENOL 0.5 MMOL/ML IV SOLN
5.0000 mL | Freq: Once | INTRAVENOUS | Status: AC | PRN
  Administered 2023-08-06: 5 mL via INTRAVENOUS

## 2023-08-09 DIAGNOSIS — I10 Essential (primary) hypertension: Secondary | ICD-10-CM | POA: Diagnosis not present

## 2023-08-09 DIAGNOSIS — Z0000 Encounter for general adult medical examination without abnormal findings: Secondary | ICD-10-CM | POA: Diagnosis not present

## 2023-08-09 DIAGNOSIS — Z1331 Encounter for screening for depression: Secondary | ICD-10-CM | POA: Diagnosis not present

## 2023-08-09 DIAGNOSIS — Z1339 Encounter for screening examination for other mental health and behavioral disorders: Secondary | ICD-10-CM | POA: Diagnosis not present

## 2023-09-13 DIAGNOSIS — H2513 Age-related nuclear cataract, bilateral: Secondary | ICD-10-CM | POA: Diagnosis not present

## 2023-09-25 ENCOUNTER — Encounter (HOSPITAL_BASED_OUTPATIENT_CLINIC_OR_DEPARTMENT_OTHER): Payer: Self-pay | Admitting: Physical Therapy

## 2023-09-25 ENCOUNTER — Ambulatory Visit (HOSPITAL_BASED_OUTPATIENT_CLINIC_OR_DEPARTMENT_OTHER): Attending: Neurology | Admitting: Physical Therapy

## 2023-09-25 DIAGNOSIS — R29818 Other symptoms and signs involving the nervous system: Secondary | ICD-10-CM | POA: Diagnosis not present

## 2023-09-25 DIAGNOSIS — R262 Difficulty in walking, not elsewhere classified: Secondary | ICD-10-CM | POA: Insufficient documentation

## 2023-09-25 DIAGNOSIS — M6281 Muscle weakness (generalized): Secondary | ICD-10-CM | POA: Diagnosis not present

## 2023-09-25 NOTE — Therapy (Signed)
 OUTPATIENT PHYSICAL THERAPY RE-EVAL/ TREATMENT NOTE   Patient Name: Kayla Guerra MRN: [id number] DOB:[birth year], 68 y.o., female Today's Date: 09/25/2023   PCP: Yolande Toribio MATSU, MD  REFERRING PROVIDER: Lennie Jamee Norris, MD    END OF SESSION:  PT End of Session - 09/25/23 1130     Visit Number 8    Number of Visits --    Date for PT Re-Evaluation 11/20/23    Authorization Type [employer] Medicare    PT Start Time 1130    PT Stop Time 1210    PT Time Calculation (min) 40 min    Activity Tolerance Patient tolerated treatment well    Behavior During Therapy WFL for tasks assessed/performed                Past Medical History:  Diagnosis Date   Anxiety    BRCA gene mutation negative    Chronic constipation    GERD (gastroesophageal reflux disease)    Headache(784.0)    Hypertension    PONV (postoperative nausea and vomiting)    Wears contact lenses    Past Surgical History:  Procedure Laterality Date   ANAL FISTULECTOMY  11/2021   Dr. Zoila at [facility]   BREAST EXCISIONAL BIOPSY Right 06-26-1999    dr honore  @[facility]    CHOLECYSTECTOMY N/A 02/22/2018   Procedure: LAPAROSCOPIC CHOLECYSTECTOMY WITH INTRAOPERATIVE CHOLANGIOGRAM ERAS PATHWAY;  Surgeon: Gladis Cough, MD;  Location: [facility];  Service: General;  Laterality: N/A;   D & C HYSTEROSCOPY WITH RESECTION POLYPS  10-10-2009    dr rivard  @[facility]    DILATION AND CURETTAGE OF UTERUS  2006   WISDOM TOOTH EXTRACTION     Patient Active Problem List   Diagnosis Date Noted   Family history of breast cancer 06/09/2022   Normocytic normochromic anemia 06/17/2021   Thrombocytopenia (HCC) 06/17/2021   Age-related osteoporosis without current pathological fracture 05/05/2021   History of recurrent UTIs 12/29/2018   Status post laparoscopic cholecystectomy Jan 2020 02/22/2018   Menopause 07/17/2011   Essential hypertension 01/05/2011    ONSET DATE: 3 years  REFERRING DIAG: R29.818 Difficulty  Balancing  THERAPY DIAG:  Muscle weakness (generalized)  Difficulty in walking, not elsewhere classified  Rationale for Evaluation and Treatment: Rehabilitation  SUBJECTIVE:                                                                                                                                                                                             SUBJECTIVE STATEMENT: Patient states that her back continues to hurt. She is required to bend and squat a lot to help with her son's needs.  Eval: Patient reports that an MRI found a pars defect and a pinched nerve in her back which was done d/t c/o B foot pain. Was recommended to exercise but reports remaining B foot pain. Reports neuropathy was ruled out. Reports imbalance on uneven surfaces like a yard. Reports UE and LE weakness, R knee pain when walking. Reports that she is fearful of what she can do without flaring up her pain. Has a 68y/o handicapped son that she has to physically assist. Reports difficulty opening things but denies dropping items.   Pt accompanied by: self  PERTINENT HISTORY: anxiety, GERD, HA, HTN, per pt- osteoporosis and essential tremors  PAIN:  Are you having pain? Yes: NPRS scale: a little bit/10 Pain location: LBP Pain description: ache Aggravating factors: bending, prolonged sitting Relieving factors: sitting up straight when sitting  PRECAUTIONS: Fall  RED FLAGS: None   WEIGHT BEARING RESTRICTIONS: No  FALLS: Has patient fallen in last 6 months? No  LIVING ENVIRONMENT: Lives with: lives with their spouse and lives with their son (son requires physical assistance d/t disability) Lives in: House/apartment Stairs: 3-4 steps to enter; 1 story Has following equipment at home: None  PLOF: Independent and retired; likes walking, going to church, working in the yard   PATIENT GOALS: improve strength and balance   OBJECTIVE:    TODAY'S TREATMENT: 08/03/23 Activity Comments 09/25/23   Single leg heel raise  Able to complete 10 reps limited ROM on R; 4-5 reps imited ROM on L   ABC 85.625% 73.75%                    LOWER EXTREMITY MMT:    MMT (in sitting) Right Eval Left Eval Right 08/03/23 Left 08/03/23 R/L  09/25/23  Hip flexion 4+ 4 4+ 4 4-/4  Hip extension       Hip abduction 4+ 4+ 4+ 4+   Hip adduction 4 4 4  4+   Hip internal rotation       Hip external rotation       Knee flexion 4+ 4 4+ 4+ 4+/4+  Knee extension 4 4+ 4+ 4+ 4+/4+  Ankle dorsiflexion 4 4 4+ 4+ 4+/4+  Ankle plantarflexion 4 4 4 4  4/4  Ankle inversion       Ankle eversion       (Blank rows = not tested)   PATIENT EDUCATION: Education details: discussed progress towards goals and remaining impairments, discussed transition to [employer] for fitness regimen; discussed pt's several responsibilities relating to caring for her adult son; 30 day hold Person educated: Patient Education method: Explanation Education comprehension: verbalized understanding     HOME EXERCISE PROGRAM Access Code: [email or web address] URL: [email or web address] Date: 07/27/2023 Prepared by: [facility] - Outpatient  Rehab - [facility]  Exercises - Squat with Chair Touch  - 1 x daily - 5 x weekly - 2 sets - 10 reps - Standing Alternating Knee Flexion with Ankle Weights  - 1 x daily - 5 x weekly - 2 sets - 10 reps - Heel Raises with Counter Support  - 1 x daily - 5 x weekly - 2 sets - 10 reps - Side Stepping with Resistance at Thighs and Counter Support  - 1 x daily - 5 x weekly - 2 sets - 30 sec hold - Forward Backward Weight Shift with Counter Support  - 1 x daily - 5 x weekly - 2 sets - 10 reps - Half-Kneeling Shoulder Extension with Resistance  - 1 x  daily - 5 x weekly - 2 sets - 10 reps - Half Kneel Row  - 1 x daily - 5 x weekly - 2 sets - 10 reps - Quadruped Alternating Arm Lift  - 1 x daily - 5 x weekly - 2 sets - 10 reps    Note: Objective measures were completed at Evaluation unless  otherwise noted.  DIAGNOSTIC FINDINGS: none recent  COGNITION: Overall cognitive status: Within functional limits for tasks assessed   SENSATION: Pt reports tingling in B feet  COORDINATION: Alternating pronation/supination: WNL Alternating toe tap: WNL Finger to nose: WFL, with tremor  POSTURE: forward head  LOWER EXTREMITY ROM:     Active  Right Eval Left Eval  Hip flexion    Hip extension    Hip abduction    Hip adduction    Hip internal rotation    Hip external rotation    Knee flexion    Knee extension    Ankle dorsiflexion 9 13  Ankle plantarflexion    Ankle inversion    Ankle eversion     (Blank rows = not tested)  LOWER EXTREMITY MMT:    MMT (in sitting) Right Eval Left Eval  Hip flexion 4+ 4  Hip extension    Hip abduction 4+ 4+  Hip adduction 4 4  Hip internal rotation    Hip external rotation    Knee flexion 4+ 4  Knee extension 4 4+  Ankle dorsiflexion 4 4  Ankle plantarflexion 4 4  Ankle inversion    Ankle eversion    (Blank rows = not tested)    GAIT: Findings: Assistive device utilized:None, Level of assistance: Complete Independence, and Comments: slight B toe out and very slight steppage gait   FUNCTIONAL TESTS:    M-CTSIB  Condition 1: Firm Surface, EO 30 Sec, Normal Sway  Condition 2: Firm Surface, EC 30 Sec, Normal Sway  Condition 3: Foam Surface, EO 30 Sec, Normal Sway  Condition 4: Foam Surface, EC 30 Sec, Mild Sway    [facility] PT Assessment - 06/23/23 0001       Standardized Balance Assessment   Standardized Balance Assessment Five Times Sit to Stand;10 meter walk test    Five times sit to stand comments  16.3 sec without UEs   slightly limited eccentric lower   10 Meter Walk 11.02 sec      Functional Gait  Assessment   Gait assessed  Yes    Gait Level Surface Walks 20 ft in less than 5.5 sec, no assistive devices, good speed, no evidence for imbalance, normal gait pattern, deviates no more than 6 in outside of the 12  in walkway width.    Change in Gait Speed Able to smoothly change walking speed without loss of balance or gait deviation. Deviate no more than 6 in outside of the 12 in walkway width.    Gait with Horizontal Head Turns Performs head turns smoothly with slight change in gait velocity (eg, minor disruption to smooth gait path), deviates 6-10 in outside 12 in walkway width, or uses an assistive device.    Gait with Vertical Head Turns Performs task with slight change in gait velocity (eg, minor disruption to smooth gait path), deviates 6 - 10 in outside 12 in walkway width or uses assistive device    Gait and Pivot Turn Pivot turns safely in greater than 3 sec and stops with no loss of balance, or pivot turns safely within 3 sec and stops with mild imbalance,  requires small steps to catch balance.    Step Over Obstacle Is able to step over 2 stacked shoe boxes taped together (9 in total height) without changing gait speed. No evidence of imbalance.    Gait with Narrow Base of Support Is able to ambulate for 10 steps heel to toe with no staggering.    Gait with Eyes Closed Walks 20 ft, no assistive devices, good speed, no evidence of imbalance, normal gait pattern, deviates no more than 6 in outside 12 in walkway width. Ambulates 20 ft in less than 7 sec.    Ambulating Backwards Walks 20 ft, no assistive devices, good speed, no evidence for imbalance, normal gait    Steps Alternating feet, no rail.    Total Score 27                                                                                                                                          TREATMENT DATE: 09/25/23: - Reval - Sit to stand with 10lb kettle bell at chest height x 10 - Sit to stand with 5lb kettle bell with OH reaches in standing. X 10 - Seated lifts with RTB x10, bilat  - Standing hip/ flexion/ extension, 12x each direction, bilat - Standing heel raises x15    PATIENT EDUCATION: Education details: prognosis, POC,  exam findings, edu on low risk of falls but goal of improving balance confidence, edu on fitness regimen to continue moving forward Person educated: Patient Education method: Explanation, Demonstration, Tactile cues, Verbal cues, and Handouts Education comprehension: verbalized understanding and returned demonstration  HOME EXERCISE PROGRAM: Not initiated   GOALS: Goals reviewed with patient? Yes  SHORT TERM GOALS: Target date: 07/14/2023  Patient to be independent with initial HEP. Baseline: HEP initiated Goal status: MET    LONG TERM GOALS: Target date: 11/20/2023  Patient to be independent with advanced HEP. Baseline: Not yet initiated  Goal status: MET 08/02/23  2. Patient to demonstrate B LE strength >/=4+/5.  Baseline: See above; improved but not quite met 08/02/23 Goal status: IN PROGRESS 08/02/23  3.  Patient to improve ABC score by at least 10 points.  Baseline: 77.8% 06/29/23; 85.625% 08/02/23 Goal status: IN PROGRESS 08/02/23  4. Patient to report plans to participate in strength training regimen several times a week to maintain PT gains.   Baseline: not started ; pt reports plans to join [employer] today 08/02/23 Goal status: MET 08/02/23 5. Pt will improve body mechanics with lifting and reaching to help take care of her son without continued pain.   Baseline:   Goal status: New   ASSESSMENT:  CLINICAL IMPRESSION: Pt arrives to PT for a re-eval after a hold from PT. She has regressed slightly with her strength and self perceived confidence with gait and balance. She reports being very wary of her osteoporosis and pinches nerve, resulting in fear of movement.  Pt would benefit from continued education about body awareness and postural control. She would also benefit from functional movement and strength to help assist with her son and basic needs without ongoing pain and fear. Pt will continue to benefit from skilled PT to address continued deficits.  OBJECTIVE  IMPAIRMENTS: Abnormal gait, decreased balance, decreased ROM, decreased strength, impaired flexibility, and postural dysfunction.   ACTIVITY LIMITATIONS: carrying, lifting, and locomotion level  PARTICIPATION LIMITATIONS: community activity and yard work  PERSONAL FACTORS: Age, Past/current experiences, and 3+ comorbidities: anxiety, GERD, HA, HTN, per pt- osteoporosis and essential tremors are also affecting patient's functional outcome.   REHAB POTENTIAL: Good  CLINICAL DECISION MAKING: Evolving/moderate complexity  EVALUATION COMPLEXITY: Moderate  PLAN:  PT FREQUENCY: 1x/week  PT DURATION: 6 weeks  PLANNED INTERVENTIONS: [id number]- PT Re-evaluation, 97750- Physical Performance Testing, 97110-Therapeutic exercises, 97530- Therapeutic activity, [id number]- Neuromuscular re-education, 97535- Self Care, [id number]- Manual therapy, [id number]- Gait training, [phone]- Canalith repositioning, Patient/Family education, Balance training, Stair training, Taping, Dry Needling, Vestibular training, Cryotherapy, and Moist heat  PLAN FOR NEXT SESSION: Continue to work on strength, balance and functional mobility to assist son with needs.     Rojean Batten PT, DPT 09/25/23  12:28 PM

## 2023-09-27 ENCOUNTER — Ambulatory Visit (HOSPITAL_BASED_OUTPATIENT_CLINIC_OR_DEPARTMENT_OTHER)

## 2023-09-28 ENCOUNTER — Telehealth (HOSPITAL_COMMUNITY): Payer: Self-pay

## 2023-09-28 ENCOUNTER — Ambulatory Visit (HOSPITAL_BASED_OUTPATIENT_CLINIC_OR_DEPARTMENT_OTHER): Payer: Self-pay | Admitting: Physical Therapy

## 2023-09-28 NOTE — Telephone Encounter (Signed)
 Attempted to contact the patient to schedule VAS US .  No answer.  Left message.  First Attempt. Provided  direct contact number for scheduling: [phone].   Attempted contact to schedule vascular ultrasound requested by [facility], Dr.Paterson.

## 2023-09-28 NOTE — Telephone Encounter (Signed)
Patient returned call, scheduled.

## 2023-09-30 ENCOUNTER — Ambulatory Visit (HOSPITAL_COMMUNITY)
Admission: RE | Admit: 2023-09-30 | Discharge: 2023-09-30 | Disposition: A | Discharge status: Home / Self Care | Source: Ambulatory Visit | Attending: Vascular Surgery | Admitting: Vascular Surgery

## 2023-09-30 ENCOUNTER — Other Ambulatory Visit (HOSPITAL_COMMUNITY): Payer: Self-pay | Admitting: Internal Medicine

## 2023-09-30 DIAGNOSIS — R42 Dizziness and giddiness: Secondary | ICD-10-CM

## 2023-10-01 ENCOUNTER — Ambulatory Visit (HOSPITAL_BASED_OUTPATIENT_CLINIC_OR_DEPARTMENT_OTHER): Admitting: Physical Therapy

## 2023-10-01 ENCOUNTER — Encounter (HOSPITAL_BASED_OUTPATIENT_CLINIC_OR_DEPARTMENT_OTHER): Payer: Self-pay | Admitting: Physical Therapy

## 2023-10-01 DIAGNOSIS — R29818 Other symptoms and signs involving the nervous system: Secondary | ICD-10-CM | POA: Diagnosis not present

## 2023-10-01 DIAGNOSIS — M6281 Muscle weakness (generalized): Secondary | ICD-10-CM

## 2023-10-01 DIAGNOSIS — R262 Difficulty in walking, not elsewhere classified: Secondary | ICD-10-CM | POA: Diagnosis not present

## 2023-10-01 NOTE — Therapy (Signed)
 OUTPATIENT PHYSICAL THERAPY RE-EVAL/ TREATMENT NOTE   Patient Name: Kayla Guerra MRN: [id number] DOB:December 09, 1955, 68 y.o., female Today's Date: 10/01/2023   PCP: Yolande Toribio MATSU, MD  REFERRING PROVIDER: Lennie Jamee Norris, MD    END OF SESSION:  PT End of Session - 10/01/23 1625     Visit Number 9    Number of Visits 14    Date for PT Re-Evaluation 11/20/23    Authorization Type [employer] Medicare    PT Start Time 1345    PT Stop Time 1428    PT Time Calculation (min) 43 min    Activity Tolerance Patient tolerated treatment well    Behavior During Therapy WFL for tasks assessed/performed                 Past Medical History:  Diagnosis Date   Anxiety    BRCA gene mutation negative    Chronic constipation    GERD (gastroesophageal reflux disease)    Headache(784.0)    Hypertension    PONV (postoperative nausea and vomiting)    Wears contact lenses    Past Surgical History:  Procedure Laterality Date   ANAL FISTULECTOMY  11/2021   Dr. Zoila at [facility]   BREAST EXCISIONAL BIOPSY Right 06-26-1999    dr honore  @[facility]    CHOLECYSTECTOMY N/A 02/22/2018   Procedure: LAPAROSCOPIC CHOLECYSTECTOMY WITH INTRAOPERATIVE CHOLANGIOGRAM ERAS PATHWAY;  Surgeon: Gladis Cough, MD;  Location: [facility];  Service: General;  Laterality: N/A;   D & C HYSTEROSCOPY WITH RESECTION POLYPS  10-10-2009    dr rivard  @[facility]    DILATION AND CURETTAGE OF UTERUS  2006   WISDOM TOOTH EXTRACTION     Patient Active Problem List   Diagnosis Date Noted   Family history of breast cancer 06/09/2022   Normocytic normochromic anemia 06/17/2021   Thrombocytopenia (HCC) 06/17/2021   Age-related osteoporosis without current pathological fracture 05/05/2021   History of recurrent UTIs 12/29/2018   Status post laparoscopic cholecystectomy Jan 2020 02/22/2018   Menopause 07/17/2011   Essential hypertension 01/05/2011    ONSET DATE: 3 years  REFERRING DIAG: R29.818 Difficulty  Balancing  THERAPY DIAG:  Muscle weakness (generalized)  Difficulty in walking, not elsewhere classified  Rationale for Evaluation and Treatment: Rehabilitation  SUBJECTIVE:                                                                                                                                                                                             SUBJECTIVE STATEMENT: The patient reports she is doing OK today. She is having mild pain in her foot and heels.  Eval: Patient reports  that an MRI found a pars defect and a pinched nerve in her back which was done d/t c/o B foot pain. Was recommended to exercise but reports remaining B foot pain. Reports neuropathy was ruled out. Reports imbalance on uneven surfaces like a yard. Reports UE and LE weakness, R knee pain when walking. Reports that she is fearful of what she can do without flaring up her pain. Has a 68y/o handicapped son that she has to physically assist. Reports difficulty opening things but denies dropping items.   Pt accompanied by: self  PERTINENT HISTORY: anxiety, GERD, HA, HTN, per pt- osteoporosis and essential tremors  PAIN:  Are you having pain? Yes: NPRS scale: a little bit/10 Pain location: LBP Pain description: ache Aggravating factors: bending, prolonged sitting Relieving factors: sitting up straight when sitting  PRECAUTIONS: Fall  RED FLAGS: None   WEIGHT BEARING RESTRICTIONS: No  FALLS: Has patient fallen in last 6 months? No  LIVING ENVIRONMENT: Lives with: lives with their spouse and lives with their son (son requires physical assistance d/t disability) Lives in: House/apartment Stairs: 3-4 steps to enter; 1 story Has following equipment at home: None  PLOF: Independent and retired; likes walking, going to church, working in the yard   PATIENT GOALS: improve strength and balance   OBJECTIVE:    TODAY'S TREATMENT: 08/03/23 Activity Comments 09/25/23  Single leg heel raise  Able to  complete 10 reps limited ROM on R; 4-5 reps imited ROM on L   ABC 85.625% 73.75%                    LOWER EXTREMITY MMT:    MMT (in sitting) Right Eval Left Eval Right 08/03/23 Left 08/03/23 R/L  09/25/23  Hip flexion 4+ 4 4+ 4 4-/4  Hip extension       Hip abduction 4+ 4+ 4+ 4+   Hip adduction 4 4 4  4+   Hip internal rotation       Hip external rotation       Knee flexion 4+ 4 4+ 4+ 4+/4+  Knee extension 4 4+ 4+ 4+ 4+/4+  Ankle dorsiflexion 4 4 4+ 4+ 4+/4+  Ankle plantarflexion 4 4 4 4  4/4  Ankle inversion       Ankle eversion       (Blank rows = not tested)   PATIENT EDUCATION: Education details: discussed progress towards goals and remaining impairments, discussed transition to [employer] for fitness regimen; discussed pt's several responsibilities relating to caring for her adult son; 30 day hold Person educated: Patient Education method: Explanation Education comprehension: verbalized understanding     HOME EXERCISE PROGRAM Access Code: [email or web address] URL: [email or web address] Date: 07/27/2023 Prepared by: [facility] - Outpatient  Rehab - [facility]  Exercises - Squat with Chair Touch  - 1 x daily - 5 x weekly - 2 sets - 10 reps - Standing Alternating Knee Flexion with Ankle Weights  - 1 x daily - 5 x weekly - 2 sets - 10 reps - Heel Raises with Counter Support  - 1 x daily - 5 x weekly - 2 sets - 10 reps - Side Stepping with Resistance at Thighs and Counter Support  - 1 x daily - 5 x weekly - 2 sets - 30 sec hold - Forward Backward Weight Shift with Counter Support  - 1 x daily - 5 x weekly - 2 sets - 10 reps - Half-Kneeling Shoulder Extension with Resistance  - 1 x daily - 5  x weekly - 2 sets - 10 reps - Half Kneel Row  - 1 x daily - 5 x weekly - 2 sets - 10 reps - Quadruped Alternating Arm Lift  - 1 x daily - 5 x weekly - 2 sets - 10 reps    Note: Objective measures were completed at Evaluation unless otherwise noted.  DIAGNOSTIC  FINDINGS: none recent  COGNITION: Overall cognitive status: Within functional limits for tasks assessed   SENSATION: Pt reports tingling in B feet  COORDINATION: Alternating pronation/supination: WNL Alternating toe tap: WNL Finger to nose: WFL, with tremor  POSTURE: forward head  LOWER EXTREMITY ROM:     Active  Right Eval Left Eval  Hip flexion    Hip extension    Hip abduction    Hip adduction    Hip internal rotation    Hip external rotation    Knee flexion    Knee extension    Ankle dorsiflexion 9 13  Ankle plantarflexion    Ankle inversion    Ankle eversion     (Blank rows = not tested)  LOWER EXTREMITY MMT:    MMT (in sitting) Right Eval Left Eval  Hip flexion 4+ 4  Hip extension    Hip abduction 4+ 4+  Hip adduction 4 4  Hip internal rotation    Hip external rotation    Knee flexion 4+ 4  Knee extension 4 4+  Ankle dorsiflexion 4 4  Ankle plantarflexion 4 4  Ankle inversion    Ankle eversion    (Blank rows = not tested)    GAIT: Findings: Assistive device utilized:None, Level of assistance: Complete Independence, and Comments: slight B toe out and very slight steppage gait   FUNCTIONAL TESTS:    M-CTSIB  Condition 1: Firm Surface, EO 30 Sec, Normal Sway  Condition 2: Firm Surface, EC 30 Sec, Normal Sway  Condition 3: Foam Surface, EO 30 Sec, Normal Sway  Condition 4: Foam Surface, EC 30 Sec, Mild Sway    [facility] PT Assessment - 06/23/23 0001       Standardized Balance Assessment   Standardized Balance Assessment Five Times Sit to Stand;10 meter walk test    Five times sit to stand comments  16.3 sec without UEs   slightly limited eccentric lower   10 Meter Walk 11.02 sec      Functional Gait  Assessment   Gait assessed  Yes    Gait Level Surface Walks 20 ft in less than 5.5 sec, no assistive devices, good speed, no evidence for imbalance, normal gait pattern, deviates no more than 6 in outside of the 12 in walkway width.    Change in  Gait Speed Able to smoothly change walking speed without loss of balance or gait deviation. Deviate no more than 6 in outside of the 12 in walkway width.    Gait with Horizontal Head Turns Performs head turns smoothly with slight change in gait velocity (eg, minor disruption to smooth gait path), deviates 6-10 in outside 12 in walkway width, or uses an assistive device.    Gait with Vertical Head Turns Performs task with slight change in gait velocity (eg, minor disruption to smooth gait path), deviates 6 - 10 in outside 12 in walkway width or uses assistive device    Gait and Pivot Turn Pivot turns safely in greater than 3 sec and stops with no loss of balance, or pivot turns safely within 3 sec and stops with mild imbalance, requires small steps  to catch balance.    Step Over Obstacle Is able to step over 2 stacked shoe boxes taped together (9 in total height) without changing gait speed. No evidence of imbalance.    Gait with Narrow Base of Support Is able to ambulate for 10 steps heel to toe with no staggering.    Gait with Eyes Closed Walks 20 ft, no assistive devices, good speed, no evidence of imbalance, normal gait pattern, deviates no more than 6 in outside 12 in walkway width. Ambulates 20 ft in less than 7 sec.    Ambulating Backwards Walks 20 ft, no assistive devices, good speed, no evidence for imbalance, normal gait    Steps Alternating feet, no rail.    Total Score 27                                                                                                                                          TREATMENT DATE:  8/15 There-ex:  Ball roll x10 5 sec  Nu step 5 min   Neuro-re-ed Reviewed patient goals for exercise  Reviewed how to grade exercises  Reviewed how to use RPE for exercises   Row red band 3x12 red with abdominal breathing  Shoulder extension with abdominal breathing 3x12 red      PATIENT EDUCATION: Education details: prognosis, POC, exam findings,  edu on low risk of falls but goal of improving balance confidence, edu on fitness regimen to continue moving forward Person educated: Patient Education method: Explanation, Demonstration, Tactile cues, Verbal cues, and Handouts Education comprehension: verbalized understanding and returned demonstration  HOME EXERCISE PROGRAM: Not initiated   GOALS: Goals reviewed with patient? Yes  SHORT TERM GOALS: Target date: 07/14/2023  Patient to be independent with initial HEP. Baseline: HEP initiated Goal status: MET    LONG TERM GOALS: Target date: 11/20/2023  Patient to be independent with advanced HEP. Baseline: Not yet initiated  Goal status: MET 08/02/23  2. Patient to demonstrate B LE strength >/=4+/5.  Baseline: See above; improved but not quite met 08/02/23 Goal status: IN PROGRESS 08/02/23  3.  Patient to improve ABC score by at least 10 points.  Baseline: 77.8% 06/29/23; 85.625% 08/02/23 Goal status: IN PROGRESS 08/02/23  4. Patient to report plans to participate in strength training regimen several times a week to maintain PT gains.   Baseline: not started ; pt reports plans to join [employer] today 08/02/23 Goal status: MET 08/02/23 5. Pt will improve body mechanics with lifting and reaching to help take care of her son without continued pain.   Baseline:   Goal status: New   ASSESSMENT:  CLINICAL IMPRESSION: Patient tolerated treatment well.  We reviewed her goals for progression with her gym program.  She hopes to remain strong to be able to continue to help assist her son.  She also hopes to work on exercises stabilize her back.  We  reviewed exercises today that we will be able to transition to gym exercises depending on how she tolerates.  Reviewed the importance of transverse abdominal activation and the role of breathing with exercises.  We also began with the [employer] for cardiovascular training.  Will review different types of cardiovascular training as we progressed.  Will  continue to expand her exercise program as tolerated.  She is also interested in osteoporosis exercises.  She is advised posterior chain strengthening will help with posture. OBJECTIVE IMPAIRMENTS: Abnormal gait, decreased balance, decreased ROM, decreased strength, impaired flexibility, and postural dysfunction.   ACTIVITY LIMITATIONS: carrying, lifting, and locomotion level  PARTICIPATION LIMITATIONS: community activity and yard work  PERSONAL FACTORS: Age, Past/current experiences, and 3+ comorbidities: anxiety, GERD, HA, HTN, per pt- osteoporosis and essential tremors are also affecting patient's functional outcome.   REHAB POTENTIAL: Good  CLINICAL DECISION MAKING: Evolving/moderate complexity  EVALUATION COMPLEXITY: Moderate  PLAN:  PT FREQUENCY: 1x/week  PT DURATION: 6 weeks  PLANNED INTERVENTIONS: 97164- PT Re-evaluation, 97750- Physical Performance Testing, 97110-Therapeutic exercises, 97530- Therapeutic activity, [id number]- Neuromuscular re-education, 97535- Self Care, [id number]- Manual therapy, [id number]- Gait training, [phone]- Canalith repositioning, Patient/Family education, Balance training, Stair training, Taping, Dry Needling, Vestibular training, Cryotherapy, and Moist heat  PLAN FOR NEXT SESSION: Continue to work on strength, balance and functional mobility to assist son with needs.     Rojean Batten PT, DPT 10/01/23  4:34 PM

## 2023-10-03 ENCOUNTER — Encounter (HOSPITAL_BASED_OUTPATIENT_CLINIC_OR_DEPARTMENT_OTHER): Payer: Self-pay | Admitting: Physical Therapy

## 2023-10-05 ENCOUNTER — Ambulatory Visit (HOSPITAL_BASED_OUTPATIENT_CLINIC_OR_DEPARTMENT_OTHER): Admitting: Physical Therapy

## 2023-10-05 DIAGNOSIS — R262 Difficulty in walking, not elsewhere classified: Secondary | ICD-10-CM | POA: Diagnosis not present

## 2023-10-05 DIAGNOSIS — M6281 Muscle weakness (generalized): Secondary | ICD-10-CM

## 2023-10-05 DIAGNOSIS — R29818 Other symptoms and signs involving the nervous system: Secondary | ICD-10-CM | POA: Diagnosis not present

## 2023-10-05 NOTE — Therapy (Signed)
 OUTPATIENT PHYSICAL THERAPY TREATMENT NOTE   Patient Name: ROMONDA PARKER MRN: [id number] DOB:12-23-1955, 68 y.o., female Today's Date: 10/06/2023   PCP: Yolande Toribio MATSU, MD  REFERRING PROVIDER: Lennie Jamee Norris, MD    END OF SESSION:  PT End of Session - 10/05/23 1503     Visit Number 10    Number of Visits 14    Date for PT Re-Evaluation 11/20/23    Authorization Type BCBS Medicare    PT Start Time 1411    PT Stop Time 1453    PT Time Calculation (min) 42 min    Activity Tolerance Patient tolerated treatment well    Behavior During Therapy WFL for tasks assessed/performed                  Past Medical History:  Diagnosis Date   Anxiety    BRCA gene mutation negative    Chronic constipation    GERD (gastroesophageal reflux disease)    Headache(784.0)    Hypertension    PONV (postoperative nausea and vomiting)    Wears contact lenses    Past Surgical History:  Procedure Laterality Date   ANAL FISTULECTOMY  11/2021   Dr. Zoila at [facility]   BREAST EXCISIONAL BIOPSY Right 06-26-1999    dr honore  @[facility]    CHOLECYSTECTOMY N/A 02/22/2018   Procedure: LAPAROSCOPIC CHOLECYSTECTOMY WITH INTRAOPERATIVE CHOLANGIOGRAM ERAS PATHWAY;  Surgeon: Gladis Cough, MD;  Location: [facility];  Service: General;  Laterality: N/A;   D & C HYSTEROSCOPY WITH RESECTION POLYPS  10-10-2009    dr rivard  @[facility]    DILATION AND CURETTAGE OF UTERUS  2006   WISDOM TOOTH EXTRACTION     Patient Active Problem List   Diagnosis Date Noted   Family history of breast cancer 06/09/2022   Normocytic normochromic anemia 06/17/2021   Thrombocytopenia (HCC) 06/17/2021   Age-related osteoporosis without current pathological fracture 05/05/2021   History of recurrent UTIs 12/29/2018   Status post laparoscopic cholecystectomy Jan 2020 02/22/2018   Menopause 07/17/2011   Essential hypertension 01/05/2011    ONSET DATE: 3 years  REFERRING DIAG: R29.818 Difficulty  Balancing  THERAPY DIAG:  Muscle weakness (generalized)  Difficulty in walking, not elsewhere classified  Rationale for Evaluation and Treatment: Rehabilitation  SUBJECTIVE:                                                                                                                                                                                             SUBJECTIVE STATEMENT: Pt states she felt good after prior Rx.  Pt states she started having pain and tingling in bilat feet approx 2-2.5  years ago.  Pt had a lumbar MRI which showed pars defect and a pinched nerve.  Pt states her foot pain and tingling are better overall.  Pt reports she has been doing home exercises though not as consistent as she would like.  She has been very busy.  Pt is going to be more consistent with HEP this week.  Pt can't sit or stand for too long.  Pt states she needs more strength to take care of her son who has special needs.  Pt accompanied by: self  PERTINENT HISTORY: anxiety, GERD, HA, HTN, per pt- osteoporosis and essential tremors  PAIN:  Are you having pain? Yes: NPRS scale: 5/10 Pain location:  central LBP Pain description: ache, little tingling in L foot Aggravating factors: bending, prolonged sitting Relieving factors: sitting up straight when sitting  PRECAUTIONS: Fall  RED FLAGS: None   WEIGHT BEARING RESTRICTIONS: No  FALLS: Has patient fallen in last 6 months? No  LIVING ENVIRONMENT: Lives with: lives with their spouse and lives with their son (son requires physical assistance d/t disability) Lives in: House/apartment Stairs: 3-4 steps to enter; 1 story Has following equipment at home: None  PLOF: Independent and retired; likes walking, going to church, working in the yard   PATIENT GOALS: improve strength and balance   OBJECTIVE:    TODAY'S TREATMENT: 08/03/23 Activity Comments 09/25/23  Single leg heel raise  Able to complete 10 reps limited ROM on R; 4-5 reps imited  ROM on L   ABC 85.625% 73.75%                     Note: Objective measures were completed at Evaluation unless otherwise noted.  DIAGNOSTIC FINDINGS: none recent  COGNITION: Overall cognitive status: Within functional limits for tasks assessed   SENSATION: Pt reports tingling in B feet  COORDINATION: Alternating pronation/supination: WNL Alternating toe tap: WNL Finger to nose: WFL, with tremor  POSTURE: forward head  LOWER EXTREMITY ROM:     Active  Right Eval Left Eval  Hip flexion    Hip extension    Hip abduction    Hip adduction    Hip internal rotation    Hip external rotation    Knee flexion    Knee extension    Ankle dorsiflexion 9 13  Ankle plantarflexion    Ankle inversion    Ankle eversion     (Blank rows = not tested)    GAIT: Findings: Assistive device utilized:None, Level of assistance: Complete Independence, and Comments: slight B toe out and very slight steppage gait   FUNCTIONAL TESTS:    M-CTSIB  Condition 1: Firm Surface, EO 30 Sec, Normal Sway  Condition 2: Firm Surface, EC 30 Sec, Normal Sway  Condition 3: Foam Surface, EO 30 Sec, Normal Sway  Condition 4: Foam Surface, EC 30 Sec, Mild Sway    [facility] PT Assessment - 06/23/23 0001       Standardized Balance Assessment   Standardized Balance Assessment Five Times Sit to Stand;10 meter walk test    Five times sit to stand comments  16.3 sec without UEs   slightly limited eccentric lower   10 Meter Walk 11.02 sec      Functional Gait  Assessment   Gait assessed  Yes    Gait Level Surface Walks 20 ft in less than 5.5 sec, no assistive devices, good speed, no evidence for imbalance, normal gait pattern, deviates no more than 6 in outside of the 12  in walkway width.    Change in Gait Speed Able to smoothly change walking speed without loss of balance or gait deviation. Deviate no more than 6 in outside of the 12 in walkway width.    Gait with Horizontal Head Turns Performs head turns  smoothly with slight change in gait velocity (eg, minor disruption to smooth gait path), deviates 6-10 in outside 12 in walkway width, or uses an assistive device.    Gait with Vertical Head Turns Performs task with slight change in gait velocity (eg, minor disruption to smooth gait path), deviates 6 - 10 in outside 12 in walkway width or uses assistive device    Gait and Pivot Turn Pivot turns safely in greater than 3 sec and stops with no loss of balance, or pivot turns safely within 3 sec and stops with mild imbalance, requires small steps to catch balance.    Step Over Obstacle Is able to step over 2 stacked shoe boxes taped together (9 in total height) without changing gait speed. No evidence of imbalance.    Gait with Narrow Base of Support Is able to ambulate for 10 steps heel to toe with no staggering.    Gait with Eyes Closed Walks 20 ft, no assistive devices, good speed, no evidence of imbalance, normal gait pattern, deviates no more than 6 in outside 12 in walkway width. Ambulates 20 ft in less than 7 sec.    Ambulating Backwards Walks 20 ft, no assistive devices, good speed, no evidence for imbalance, normal gait    Steps Alternating feet, no rail.    Total Score 27             LOWER EXTREMITY MMT:    MMT (in sitting) Right Eval Left Eval Right 08/03/23 Left 08/03/23 R/L  09/25/23 Right 8/19 Left 8/19  Hip flexion 4+ 4 4+ 4 4-/4 30.4 26.3  Hip extension         Hip abduction 4+ 4+ 4+ 4+  28.0 30.4  Hip adduction 4 4 4  4+     Hip internal rotation         Hip external rotation         Knee flexion 4+ 4 4+ 4+ 4+/4+    Knee extension 4 4+ 4+ 4+ 4+/4+    Ankle dorsiflexion 4 4 4+ 4+ 4+/4+    Ankle plantarflexion 4 4 4 4  4/4    Ankle inversion         Ankle eversion         (Blank rows = not tested)                                                                                                                             TREATMENT DATE:  8/19  Ther-ex: Reviewed pt  presentation, pain level, and response to prior Rx.  PT assessed LE strength.  PT educated pt in objective findings and in comparison to the norms.  Nu step 5 min L3 bilat UE/LEs Sit to stands from chair 2x10   Neuro-re-ed Rows with RTB with scap retraction with TrA 2x12-15 Standing shoulder extension with RTB with scap retraction with TrA 2x12  Standing paloff press with RTB with TrA x10 each    PATIENT EDUCATION: Education details: strength findings and norms, exercise form, exercise rationale Person educated: Patient Education method: Explanation, demonstration, verbal and tactile cues Education comprehension: verbalized understanding, returned demonstration, verbal and tactile cues required  HOME EXERCISE PROGRAM: HOME EXERCISE PROGRAM Access Code: [email or web address] URL: [email or web address] Date: 07/27/2023 Prepared by: [facility] - Outpatient  Rehab - [facility]  Exercises - Squat with Chair Touch  - 1 x daily - 5 x weekly - 2 sets - 10 reps - Standing Alternating Knee Flexion with Ankle Weights  - 1 x daily - 5 x weekly - 2 sets - 10 reps - Heel Raises with Counter Support  - 1 x daily - 5 x weekly - 2 sets - 10 reps - Side Stepping with Resistance at Thighs and Counter Support  - 1 x daily - 5 x weekly - 2 sets - 30 sec hold - Forward Backward Weight Shift with Counter Support  - 1 x daily - 5 x weekly - 2 sets - 10 reps - Half-Kneeling Shoulder Extension with Resistance  - 1 x daily - 5 x weekly - 2 sets - 10 reps - Half Kneel Row  - 1 x daily - 5 x weekly - 2 sets - 10 reps - Quadruped Alternating Arm Lift  - 1 x daily - 5 x weekly - 2 sets - 10 reps  GOALS: Goals reviewed with patient? Yes  SHORT TERM GOALS: Target date: 07/14/2023  Patient to be independent with initial HEP. Baseline: HEP initiated Goal status: MET    LONG TERM GOALS: Target date: 11/20/2023  Patient to be independent with advanced HEP. Baseline: Not yet initiated  Goal  status: MET 08/02/23  2. Patient to demonstrate B LE strength >/=4+/5.  Baseline: See above; improved but not quite met 08/02/23 Goal status: IN PROGRESS 08/02/23  3.  Patient to improve ABC score by at least 10 points.  Baseline: 77.8% 06/29/23; 85.625% 08/02/23 Goal status: IN PROGRESS 08/02/23  4. Patient to report plans to participate in strength training regimen several times a week to maintain PT gains.   Baseline: not started ; pt reports plans to join [employer] today 08/02/23 Goal status: MET 08/02/23 5. Pt will improve body mechanics with lifting and reaching to help take care of her son without continued pain.   Baseline:   Goal status: New   ASSESSMENT:  CLINICAL IMPRESSION: PT assessed LE strength with HHD and explained to pt the results including with comparison to norms.  Pt performed exercises to improve postural, functional, core, and scapular strength.  PT provided cuing and instruction in correct form and positioning with exercises.  Pt tolerated exercises well and states she feels good after treatment.  She had no increased pain after treatment.  OBJECTIVE IMPAIRMENTS: Abnormal gait, decreased balance, decreased ROM, decreased strength, impaired flexibility, and postural dysfunction.   ACTIVITY LIMITATIONS: carrying, lifting, and locomotion level  PARTICIPATION LIMITATIONS: community activity and yard work  PERSONAL FACTORS: Age, Past/current experiences, and 3+ comorbidities: anxiety, GERD, HA, HTN, per pt- osteoporosis and essential tremors are also affecting patient's functional outcome.   REHAB POTENTIAL: Good  CLINICAL DECISION MAKING: Evolving/moderate complexity  EVALUATION COMPLEXITY: Moderate  PLAN:  PT FREQUENCY: 1x/week  PT DURATION: 6 weeks  PLANNED INTERVENTIONS: 97164- PT Re-evaluation, 97750- Physical Performance Testing, 97110-Therapeutic exercises, 97530- Therapeutic activity, [id number]- Neuromuscular re-education, 97535- Self Care, [id number]- Manual  therapy, [phone]- Gait training, [phone]- Canalith repositioning, Patient/Family education, Balance training, Stair training, Taping, Dry Needling, Vestibular training, Cryotherapy, and Moist heat  PLAN FOR NEXT SESSION: Continue to work on strength, balance and functional mobility to assist son with needs.    Leigh Minerva III PT, DPT 10/06/23 9:26 PM

## 2023-10-06 ENCOUNTER — Encounter (HOSPITAL_BASED_OUTPATIENT_CLINIC_OR_DEPARTMENT_OTHER): Payer: Self-pay | Admitting: Physical Therapy

## 2023-10-12 ENCOUNTER — Encounter (HOSPITAL_BASED_OUTPATIENT_CLINIC_OR_DEPARTMENT_OTHER): Payer: Self-pay | Admitting: Physical Therapy

## 2023-10-12 ENCOUNTER — Ambulatory Visit (HOSPITAL_BASED_OUTPATIENT_CLINIC_OR_DEPARTMENT_OTHER): Admitting: Physical Therapy

## 2023-10-12 DIAGNOSIS — M6281 Muscle weakness (generalized): Secondary | ICD-10-CM

## 2023-10-12 DIAGNOSIS — R262 Difficulty in walking, not elsewhere classified: Secondary | ICD-10-CM | POA: Diagnosis not present

## 2023-10-12 DIAGNOSIS — R29818 Other symptoms and signs involving the nervous system: Secondary | ICD-10-CM | POA: Diagnosis not present

## 2023-10-12 NOTE — Therapy (Unsigned)
 OUTPATIENT PHYSICAL THERAPY TREATMENT NOTE   Patient Name: Kayla Guerra MRN: [id number] DOB:24-Aug-1955, 68 y.o., female Today's Date: 10/13/2023   PCP: Yolande Toribio MATSU, MD  REFERRING PROVIDER: Lennie Jamee Norris, MD    END OF SESSION:  PT End of Session - 10/12/23 1153     Visit Number 11    Number of Visits 14    Date for PT Re-Evaluation 11/20/23    Authorization Type BCBS Medicare    PT Start Time 1145    PT Stop Time 1228    PT Time Calculation (min) 43 min    Activity Tolerance Patient tolerated treatment well    Behavior During Therapy WFL for tasks assessed/performed                  Past Medical History:  Diagnosis Date   Anxiety    BRCA gene mutation negative    Chronic constipation    GERD (gastroesophageal reflux disease)    Headache(784.0)    Hypertension    PONV (postoperative nausea and vomiting)    Wears contact lenses    Past Surgical History:  Procedure Laterality Date   ANAL FISTULECTOMY  11/2021   Dr. Zoila at [facility]   BREAST EXCISIONAL BIOPSY Right 06-26-1999    dr honore  @[facility]    CHOLECYSTECTOMY N/A 02/22/2018   Procedure: LAPAROSCOPIC CHOLECYSTECTOMY WITH INTRAOPERATIVE CHOLANGIOGRAM ERAS PATHWAY;  Surgeon: Gladis Cough, MD;  Location: [facility];  Service: General;  Laterality: N/A;   D & C HYSTEROSCOPY WITH RESECTION POLYPS  10-10-2009    dr rivard  @[facility]    DILATION AND CURETTAGE OF UTERUS  2006   WISDOM TOOTH EXTRACTION     Patient Active Problem List   Diagnosis Date Noted   Family history of breast cancer 06/09/2022   Normocytic normochromic anemia 06/17/2021   Thrombocytopenia (HCC) 06/17/2021   Age-related osteoporosis without current pathological fracture 05/05/2021   History of recurrent UTIs 12/29/2018   Status post laparoscopic cholecystectomy Jan 2020 02/22/2018   Menopause 07/17/2011   Essential hypertension 01/05/2011    ONSET DATE: 3 years  REFERRING DIAG: R29.818 Difficulty  Balancing  THERAPY DIAG:  Muscle weakness (generalized)  Difficulty in walking, not elsewhere classified  Rationale for Evaluation and Treatment: Rehabilitation  SUBJECTIVE:                                                                                                                                                                                             SUBJECTIVE STATEMENT: The patient continues to do well. She had no significant pain after the last visit.  Pt states she started having pain  and tingling in bilat feet approx 2-2.5 years ago.  Pt had a lumbar MRI which showed pars defect and a pinched nerve.  Pt states her foot pain and tingling are better overall.  Pt reports she has been doing home exercises though not as consistent as she would like.  She has been very busy.  Pt is going to be more consistent with HEP this week.  Pt can't sit or stand for too long.  Pt states she needs more strength to take care of her son who has special needs.  Pt accompanied by: self  PERTINENT HISTORY: anxiety, GERD, HA, HTN, per pt- osteoporosis and essential tremors  PAIN:  Are you having pain? Yes: NPRS scale: 5/10 Pain location:  central LBP Pain description: ache, little tingling in L foot Aggravating factors: bending, prolonged sitting Relieving factors: sitting up straight when sitting  PRECAUTIONS: Fall  RED FLAGS: None   WEIGHT BEARING RESTRICTIONS: No  FALLS: Has patient fallen in last 6 months? No  LIVING ENVIRONMENT: Lives with: lives with their spouse and lives with their son (son requires physical assistance d/t disability) Lives in: House/apartment Stairs: 3-4 steps to enter; 1 story Has following equipment at home: None  PLOF: Independent and retired; likes walking, going to church, working in the yard   PATIENT GOALS: improve strength and balance   OBJECTIVE:    TODAY'S TREATMENT: 08/03/23 Activity Comments 09/25/23  Single leg heel raise  Able to complete  10 reps limited ROM on R; 4-5 reps imited ROM on L   ABC 85.625% 73.75%                     Note: Objective measures were completed at Evaluation unless otherwise noted.  DIAGNOSTIC FINDINGS: none recent  COGNITION: Overall cognitive status: Within functional limits for tasks assessed   SENSATION: Pt reports tingling in B feet  COORDINATION: Alternating pronation/supination: WNL Alternating toe tap: WNL Finger to nose: WFL, with tremor  POSTURE: forward head  LOWER EXTREMITY ROM:     Active  Right Eval Left Eval  Hip flexion    Hip extension    Hip abduction    Hip adduction    Hip internal rotation    Hip external rotation    Knee flexion    Knee extension    Ankle dorsiflexion 9 13  Ankle plantarflexion    Ankle inversion    Ankle eversion     (Blank rows = not tested)    GAIT: Findings: Assistive device utilized:None, Level of assistance: Complete Independence, and Comments: slight B toe out and very slight steppage gait   FUNCTIONAL TESTS:    M-CTSIB  Condition 1: Firm Surface, EO 30 Sec, Normal Sway  Condition 2: Firm Surface, EC 30 Sec, Normal Sway  Condition 3: Foam Surface, EO 30 Sec, Normal Sway  Condition 4: Foam Surface, EC 30 Sec, Mild Sway    [facility] PT Assessment - 06/23/23 0001       Standardized Balance Assessment   Standardized Balance Assessment Five Times Sit to Stand;10 meter walk test    Five times sit to stand comments  16.3 sec without UEs   slightly limited eccentric lower   10 Meter Walk 11.02 sec      Functional Gait  Assessment   Gait assessed  Yes    Gait Level Surface Walks 20 ft in less than 5.5 sec, no assistive devices, good speed, no evidence for imbalance, normal gait pattern, deviates no more  than 6 in outside of the 12 in walkway width.    Change in Gait Speed Able to smoothly change walking speed without loss of balance or gait deviation. Deviate no more than 6 in outside of the 12 in walkway width.    Gait with  Horizontal Head Turns Performs head turns smoothly with slight change in gait velocity (eg, minor disruption to smooth gait path), deviates 6-10 in outside 12 in walkway width, or uses an assistive device.    Gait with Vertical Head Turns Performs task with slight change in gait velocity (eg, minor disruption to smooth gait path), deviates 6 - 10 in outside 12 in walkway width or uses assistive device    Gait and Pivot Turn Pivot turns safely in greater than 3 sec and stops with no loss of balance, or pivot turns safely within 3 sec and stops with mild imbalance, requires small steps to catch balance.    Step Over Obstacle Is able to step over 2 stacked shoe boxes taped together (9 in total height) without changing gait speed. No evidence of imbalance.    Gait with Narrow Base of Support Is able to ambulate for 10 steps heel to toe with no staggering.    Gait with Eyes Closed Walks 20 ft, no assistive devices, good speed, no evidence of imbalance, normal gait pattern, deviates no more than 6 in outside 12 in walkway width. Ambulates 20 ft in less than 7 sec.    Ambulating Backwards Walks 20 ft, no assistive devices, good speed, no evidence for imbalance, normal gait    Steps Alternating feet, no rail.    Total Score 27             LOWER EXTREMITY MMT:    MMT (in sitting) Right Eval Left Eval Right 08/03/23 Left 08/03/23 R/L  09/25/23 Right 8/19 Left 8/19  Hip flexion 4+ 4 4+ 4 4-/4 30.4 26.3  Hip extension         Hip abduction 4+ 4+ 4+ 4+  28.0 30.4  Hip adduction 4 4 4  4+     Hip internal rotation         Hip external rotation         Knee flexion 4+ 4 4+ 4+ 4+/4+    Knee extension 4 4+ 4+ 4+ 4+/4+    Ankle dorsiflexion 4 4 4+ 4+ 4+/4+    Ankle plantarflexion 4 4 4 4  4/4    Ankle inversion         Ankle eversion         (Blank rows = not tested)                                                                                                                             TREATMENT  DATE:  8/26 There-ex Nu-step 5 min  LTR x20  Gluteal stretch 3x20 sec hold   Neuro-re-ed: LF Triceps 20 lbs 3x12  LF  Row Machine 10 lbs 3x12  LF hip abduction 30 lbs 3x12   8/19  Ther-ex: Reviewed pt presentation, pain level, and response to prior Rx.  PT assessed LE strength.  PT educated pt in objective findings and in comparison to the norms.   Nu step 5 min L3 bilat UE/LEs Sit to stands from chair 2x10   Neuro-re-ed Rows with RTB with scap retraction with TrA 2x12-15 Standing shoulder extension with RTB with scap retraction with TrA 2x12  Standing paloff press with RTB with TrA x10 each    PATIENT EDUCATION: Education details: strength findings and norms, exercise form, exercise rationale Person educated: Patient Education method: Explanation, demonstration, verbal and tactile cues Education comprehension: verbalized understanding, returned demonstration, verbal and tactile cues required  HOME EXERCISE PROGRAM: HOME EXERCISE PROGRAM Access Code: [email or web address] URL: [email or web address] Date: 07/27/2023 Prepared by: [facility] - Outpatient  Rehab - [facility]  Exercises - Squat with Chair Touch  - 1 x daily - 5 x weekly - 2 sets - 10 reps - Standing Alternating Knee Flexion with Ankle Weights  - 1 x daily - 5 x weekly - 2 sets - 10 reps - Heel Raises with Counter Support  - 1 x daily - 5 x weekly - 2 sets - 10 reps - Side Stepping with Resistance at Thighs and Counter Support  - 1 x daily - 5 x weekly - 2 sets - 30 sec hold - Forward Backward Weight Shift with Counter Support  - 1 x daily - 5 x weekly - 2 sets - 10 reps - Half-Kneeling Shoulder Extension with Resistance  - 1 x daily - 5 x weekly - 2 sets - 10 reps - Half Kneel Row  - 1 x daily - 5 x weekly - 2 sets - 10 reps - Quadruped Alternating Arm Lift  - 1 x daily - 5 x weekly - 2 sets - 10 reps  GOALS: Goals reviewed with patient? Yes  SHORT TERM GOALS: Target date: 07/14/2023  Patient  to be independent with initial HEP. Baseline: HEP initiated Goal status: MET    LONG TERM GOALS: Target date: 11/20/2023  Patient to be independent with advanced HEP. Baseline: Not yet initiated  Goal status: MET 08/02/23  2. Patient to demonstrate B LE strength >/=4+/5.  Baseline: See above; improved but not quite met 08/02/23 Goal status: IN PROGRESS 08/02/23  3.  Patient to improve ABC score by at least 10 points.  Baseline: 77.8% 06/29/23; 85.625% 08/02/23 Goal status: IN PROGRESS 08/02/23  4. Patient to report plans to participate in strength training regimen several times a week to maintain PT gains.   Baseline: not started ; pt reports plans to join [employer] today 08/02/23 Goal status: MET 08/02/23 5. Pt will improve body mechanics with lifting and reaching to help take care of her son without continued pain.   Baseline:   Goal status: New   ASSESSMENT:  CLINICAL IMPRESSION: Therapy advanced the patient to the gym today. We worked on grading her exercises properly. All of her weights were graded around a 4/10 on the RPE scale. She tolerated well. We reviewed proper set up of machines. We also reviewed how to performs TA breathing with each of her core exercises.  OBJECTIVE IMPAIRMENTS: Abnormal gait, decreased balance, decreased ROM, decreased strength, impaired flexibility, and postural dysfunction.   ACTIVITY LIMITATIONS: carrying, lifting, and locomotion level  PARTICIPATION LIMITATIONS: community activity and yard work  PERSONAL FACTORS: Age, Past/current experiences, and 3+ comorbidities: anxiety,  GERD, HA, HTN, per pt- osteoporosis and essential tremors are also affecting patient's functional outcome.   REHAB POTENTIAL: Good  CLINICAL DECISION MAKING: Evolving/moderate complexity  EVALUATION COMPLEXITY: Moderate  PLAN:  PT FREQUENCY: 1x/week  PT DURATION: 6 weeks  PLANNED INTERVENTIONS: 97164- PT Re-evaluation, 97750- Physical Performance Testing,  97110-Therapeutic exercises, 97530- Therapeutic activity, [id number]- Neuromuscular re-education, 97535- Self Care, 02859- Manual therapy, [id number]- Gait training, [phone]- Canalith repositioning, Patient/Family education, Balance training, Stair training, Taping, Dry Needling, Vestibular training, Cryotherapy, and Moist heat  PLAN FOR NEXT SESSION: Continue to work on strength, balance and functional mobility to assist son with needs.   Alm Don PT DPT 10/13/23 7:16 AM

## 2023-10-13 ENCOUNTER — Encounter (HOSPITAL_BASED_OUTPATIENT_CLINIC_OR_DEPARTMENT_OTHER): Payer: Self-pay | Admitting: Physical Therapy

## 2023-10-19 DIAGNOSIS — H2511 Age-related nuclear cataract, right eye: Secondary | ICD-10-CM | POA: Diagnosis not present

## 2023-10-19 DIAGNOSIS — H25043 Posterior subcapsular polar age-related cataract, bilateral: Secondary | ICD-10-CM | POA: Diagnosis not present

## 2023-10-19 DIAGNOSIS — H18413 Arcus senilis, bilateral: Secondary | ICD-10-CM | POA: Diagnosis not present

## 2023-10-19 DIAGNOSIS — H2513 Age-related nuclear cataract, bilateral: Secondary | ICD-10-CM | POA: Diagnosis not present

## 2023-10-19 DIAGNOSIS — H04123 Dry eye syndrome of bilateral lacrimal glands: Secondary | ICD-10-CM | POA: Diagnosis not present

## 2023-10-20 ENCOUNTER — Encounter (HOSPITAL_BASED_OUTPATIENT_CLINIC_OR_DEPARTMENT_OTHER): Payer: Self-pay | Admitting: Physical Therapy

## 2023-10-20 ENCOUNTER — Ambulatory Visit (HOSPITAL_BASED_OUTPATIENT_CLINIC_OR_DEPARTMENT_OTHER): Attending: Neurology | Admitting: Physical Therapy

## 2023-10-20 DIAGNOSIS — R262 Difficulty in walking, not elsewhere classified: Secondary | ICD-10-CM | POA: Insufficient documentation

## 2023-10-20 DIAGNOSIS — M6281 Muscle weakness (generalized): Secondary | ICD-10-CM | POA: Diagnosis not present

## 2023-10-20 NOTE — Therapy (Signed)
 OUTPATIENT PHYSICAL THERAPY TREATMENT NOTE   Patient Name: Kayla Guerra MRN: [id number] DOB:20-Sep-1955, 68 y.o., female Today's Date: 10/20/2023   PCP: Yolande Toribio MATSU, MD  REFERRING PROVIDER: Lennie Jamee Norris, MD    END OF SESSION:  PT End of Session - 10/20/23 1109     Visit Number 12    Number of Visits 14    Date for PT Re-Evaluation 11/20/23    Authorization Type BCBS Medicare    PT Start Time 1100    PT Stop Time 1142    PT Time Calculation (min) 42 min    Activity Tolerance Patient tolerated treatment well    Behavior During Therapy WFL for tasks assessed/performed                  Past Medical History:  Diagnosis Date   Anxiety    BRCA gene mutation negative    Chronic constipation    GERD (gastroesophageal reflux disease)    Headache(784.0)    Hypertension    PONV (postoperative nausea and vomiting)    Wears contact lenses    Past Surgical History:  Procedure Laterality Date   ANAL FISTULECTOMY  11/2021   Dr. Zoila at [facility]   BREAST EXCISIONAL BIOPSY Right 06-26-1999    dr honore  @[email or web address]    CHOLECYSTECTOMY N/A 02/22/2018   Procedure: LAPAROSCOPIC CHOLECYSTECTOMY WITH INTRAOPERATIVE CHOLANGIOGRAM ERAS PATHWAY;  Surgeon: Gladis Cough, MD;  Location: [facility];  Service: General;  Laterality: N/A;   D & C HYSTEROSCOPY WITH RESECTION POLYPS  10-10-2009    dr rivard  @[facility]    DILATION AND CURETTAGE OF UTERUS  2006   WISDOM TOOTH EXTRACTION     Patient Active Problem List   Diagnosis Date Noted   Family history of breast cancer 06/09/2022   Normocytic normochromic anemia 06/17/2021   Thrombocytopenia (HCC) 06/17/2021   Age-related osteoporosis without current pathological fracture 05/05/2021   History of recurrent UTIs 12/29/2018   Status post laparoscopic cholecystectomy Jan 2020 02/22/2018   Menopause 07/17/2011   Essential hypertension 01/05/2011    ONSET DATE: 3 years  REFERRING DIAG: R29.818 Difficulty  Balancing  THERAPY DIAG:  Muscle weakness (generalized)  Difficulty in walking, not elsewhere classified  Rationale for Evaluation and Treatment: Rehabilitation  SUBJECTIVE:                                                                                                                                                                                             SUBJECTIVE STATEMENT: The patient tolerated last treatment well.  She had a little back pain but overall she did well.    Pt  states she started having pain and tingling in bilat feet approx 2-2.5 years ago.  Pt had a lumbar MRI which showed pars defect and a pinched nerve.  Pt states her foot pain and tingling are better overall.  Pt reports she has been doing home exercises though not as consistent as she would like.  She has been very busy.  Pt is going to be more consistent with HEP this week.  Pt can't sit or stand for too long.  Pt states she needs more strength to take care of her son who has special needs.  Pt accompanied by: self  PERTINENT HISTORY: anxiety, GERD, HA, HTN, per pt- osteoporosis and essential tremors  PAIN:  Are you having pain? Yes: NPRS scale: 5/10 Pain location:  central LBP Pain description: ache, little tingling in L foot Aggravating factors: bending, prolonged sitting Relieving factors: sitting up straight when sitting  PRECAUTIONS: Fall  RED FLAGS: None   WEIGHT BEARING RESTRICTIONS: No  FALLS: Has patient fallen in last 6 months? No  LIVING ENVIRONMENT: Lives with: lives with their spouse and lives with their son (son requires physical assistance d/t disability) Lives in: House/apartment Stairs: 3-4 steps to enter; 1 story Has following equipment at home: None  PLOF: Independent and retired; likes walking, going to church, working in the yard   PATIENT GOALS: improve strength and balance   OBJECTIVE:    TODAY'S TREATMENT: 08/03/23 Activity Comments 09/25/23  Single leg heel raise   Able to complete 10 reps limited ROM on R; 4-5 reps imited ROM on L   ABC 85.625% 73.75%                     Note: Objective measures were completed at Evaluation unless otherwise noted.  DIAGNOSTIC FINDINGS: none recent  COGNITION: Overall cognitive status: Within functional limits for tasks assessed   SENSATION: Pt reports tingling in B feet  COORDINATION: Alternating pronation/supination: WNL Alternating toe tap: WNL Finger to nose: WFL, with tremor  POSTURE: forward head  LOWER EXTREMITY ROM:     Active  Right Eval Left Eval  Hip flexion    Hip extension    Hip abduction    Hip adduction    Hip internal rotation    Hip external rotation    Knee flexion    Knee extension    Ankle dorsiflexion 9 13  Ankle plantarflexion    Ankle inversion    Ankle eversion     (Blank rows = not tested)    GAIT: Findings: Assistive device utilized:None, Level of assistance: Complete Independence, and Comments: slight B toe out and very slight steppage gait   FUNCTIONAL TESTS:    M-CTSIB  Condition 1: Firm Surface, EO 30 Sec, Normal Sway  Condition 2: Firm Surface, EC 30 Sec, Normal Sway  Condition 3: Foam Surface, EO 30 Sec, Normal Sway  Condition 4: Foam Surface, EC 30 Sec, Mild Sway    [facility] PT Assessment - 06/23/23 0001       Standardized Balance Assessment   Standardized Balance Assessment Five Times Sit to Stand;10 meter walk test    Five times sit to stand comments  16.3 sec without UEs   slightly limited eccentric lower   10 Meter Walk 11.02 sec      Functional Gait  Assessment   Gait assessed  Yes    Gait Level Surface Walks 20 ft in less than 5.5 sec, no assistive devices, good speed, no evidence for imbalance, normal  gait pattern, deviates no more than 6 in outside of the 12 in walkway width.    Change in Gait Speed Able to smoothly change walking speed without loss of balance or gait deviation. Deviate no more than 6 in outside of the 12 in walkway  width.    Gait with Horizontal Head Turns Performs head turns smoothly with slight change in gait velocity (eg, minor disruption to smooth gait path), deviates 6-10 in outside 12 in walkway width, or uses an assistive device.    Gait with Vertical Head Turns Performs task with slight change in gait velocity (eg, minor disruption to smooth gait path), deviates 6 - 10 in outside 12 in walkway width or uses assistive device    Gait and Pivot Turn Pivot turns safely in greater than 3 sec and stops with no loss of balance, or pivot turns safely within 3 sec and stops with mild imbalance, requires small steps to catch balance.    Step Over Obstacle Is able to step over 2 stacked shoe boxes taped together (9 in total height) without changing gait speed. No evidence of imbalance.    Gait with Narrow Base of Support Is able to ambulate for 10 steps heel to toe with no staggering.    Gait with Eyes Closed Walks 20 ft, no assistive devices, good speed, no evidence of imbalance, normal gait pattern, deviates no more than 6 in outside 12 in walkway width. Ambulates 20 ft in less than 7 sec.    Ambulating Backwards Walks 20 ft, no assistive devices, good speed, no evidence for imbalance, normal gait    Steps Alternating feet, no rail.    Total Score 27             LOWER EXTREMITY MMT:    MMT (in sitting) Right Eval Left Eval Right 08/03/23 Left 08/03/23 R/L  09/25/23 Right 8/19 Left 8/19  Hip flexion 4+ 4 4+ 4 4-/4 30.4 26.3  Hip extension         Hip abduction 4+ 4+ 4+ 4+  28.0 30.4  Hip adduction 4 4 4  4+     Hip internal rotation         Hip external rotation         Knee flexion 4+ 4 4+ 4+ 4+/4+    Knee extension 4 4+ 4+ 4+ 4+/4+    Ankle dorsiflexion 4 4 4+ 4+ 4+/4+    Ankle plantarflexion 4 4 4 4  4/4    Ankle inversion         Ankle eversion         (Blank rows = not tested)                                                                                                                              TREATMENT DATE:  9/3  Neuro-re-ed:  [place] Row 10 lbs 3x12  Cable extension 10lbs 3x12 Goblet squat 5 lbs 3x12  TRX squat 3x10  Bicep curl 5 lbs 3x10 with cuing for   Reviewed RPE and how to grade exercises.   8/26 There-ex Nu-step 5 min  LTR x20  Gluteal stretch 3x20 sec hold   Neuro-re-ed: LF Triceps 20 lbs 3x12  LF Row Machine 10 lbs 3x12  LF hip abduction 30 lbs 3x12   8/19  Ther-ex: Reviewed pt presentation, pain level, and response to prior Rx.  PT assessed LE strength.  PT educated pt in objective findings and in comparison to the norms.   Nu step 5 min L3 bilat UE/LEs Sit to stands from chair 2x10   Neuro-re-ed Rows with RTB with scap retraction with TrA 2x12-15 Standing shoulder extension with RTB with scap retraction with TrA 2x12  Standing paloff press with RTB with TrA x10 each    PATIENT EDUCATION: Education details: strength findings and norms, exercise form, exercise rationale Person educated: Patient Education method: Explanation, demonstration, verbal and tactile cues Education comprehension: verbalized understanding, returned demonstration, verbal and tactile cues required  HOME EXERCISE PROGRAM: HOME EXERCISE PROGRAM Access Code: [email or web address] URL: [email or web address] Date: 07/27/2023 Prepared by: [facility] - Outpatient  Rehab - [facility]  Exercises - Squat with Chair Touch  - 1 x daily - 5 x weekly - 2 sets - 10 reps - Standing Alternating Knee Flexion with Ankle Weights  - 1 x daily - 5 x weekly - 2 sets - 10 reps - Heel Raises with Counter Support  - 1 x daily - 5 x weekly - 2 sets - 10 reps - Side Stepping with Resistance at Thighs and Counter Support  - 1 x daily - 5 x weekly - 2 sets - 30 sec hold - Forward Backward Weight Shift with Counter Support  - 1 x daily - 5 x weekly - 2 sets - 10 reps - Half-Kneeling Shoulder Extension with Resistance  - 1 x daily - 5 x weekly - 2 sets - 10 reps - Half Kneel Row   - 1 x daily - 5 x weekly - 2 sets - 10 reps - Quadruped Alternating Arm Lift  - 1 x daily - 5 x weekly - 2 sets - 10 reps  GOALS: Goals reviewed with patient? Yes  SHORT TERM GOALS: Target date: 07/14/2023  Patient to be independent with initial HEP. Baseline: HEP initiated Goal status: MET    LONG TERM GOALS: Target date: 11/20/2023  Patient to be independent with advanced HEP. Baseline: Not yet initiated  Goal status: MET 08/02/23  2. Patient to demonstrate B LE strength >/=4+/5.  Baseline: See above; improved but not quite met 08/02/23 Goal status: IN PROGRESS 08/02/23  3.  Patient to improve ABC score by at least 10 points.  Baseline: 77.8% 06/29/23; 85.625% 08/02/23 Goal status: IN PROGRESS 08/02/23  4. Patient to report plans to participate in strength training regimen several times a week to maintain PT gains.   Baseline: not started ; pt reports plans to join [employer] today 08/02/23 Goal status: MET 08/02/23 5. Pt will improve body mechanics with lifting and reaching to help take care of her son without continued pain.   Baseline:   Goal status: New   ASSESSMENT:  CLINICAL IMPRESSION: Therapy continues to expand the patients exercise program. We reviewed free weight and cable exercises today. She had no significant pain she require min cuing for technique with exercises. We continue to work on grading her exercises. We updated her HEP. We also reviewed the ex bike today.  She was encouraged to try different cardio activity.   OBJECTIVE IMPAIRMENTS: Abnormal gait, decreased balance, decreased ROM, decreased strength, impaired flexibility, and postural dysfunction.   ACTIVITY LIMITATIONS: carrying, lifting, and locomotion level  PARTICIPATION LIMITATIONS: community activity and yard work  PERSONAL FACTORS: Age, Past/current experiences, and 3+ comorbidities: anxiety, GERD, HA, HTN, per pt- osteoporosis and essential tremors are also affecting patient's functional outcome.    REHAB POTENTIAL: Good  CLINICAL DECISION MAKING: Evolving/moderate complexity  EVALUATION COMPLEXITY: Moderate  PLAN:  PT FREQUENCY: 1x/week  PT DURATION: 6 weeks  PLANNED INTERVENTIONS: 97164- PT Re-evaluation, 97750- Physical Performance Testing, 97110-Therapeutic exercises, 97530- Therapeutic activity, [id number]- Neuromuscular re-education, 97535- Self Care, [id number]- Manual therapy, [id number]- Gait training, [id number]- Canalith repositioning, Patient/Family education, Balance training, Stair training, Taping, Dry Needling, Vestibular training, Cryotherapy, and Moist heat  PLAN FOR NEXT SESSION: Continue to work on strength, balance and functional mobility to assist son with needs.   Alm Don PT DPT 10/20/23 1:10 PM

## 2023-10-26 ENCOUNTER — Encounter (HOSPITAL_BASED_OUTPATIENT_CLINIC_OR_DEPARTMENT_OTHER): Admitting: Physical Therapy

## 2023-10-28 ENCOUNTER — Ambulatory Visit (HOSPITAL_BASED_OUTPATIENT_CLINIC_OR_DEPARTMENT_OTHER): Admitting: Physical Therapy

## 2023-10-29 ENCOUNTER — Encounter (HOSPITAL_BASED_OUTPATIENT_CLINIC_OR_DEPARTMENT_OTHER): Payer: Self-pay | Admitting: Physical Therapy

## 2023-10-29 ENCOUNTER — Ambulatory Visit (HOSPITAL_BASED_OUTPATIENT_CLINIC_OR_DEPARTMENT_OTHER): Admitting: Physical Therapy

## 2023-10-29 DIAGNOSIS — R262 Difficulty in walking, not elsewhere classified: Secondary | ICD-10-CM | POA: Diagnosis not present

## 2023-10-29 DIAGNOSIS — M6281 Muscle weakness (generalized): Secondary | ICD-10-CM | POA: Diagnosis not present

## 2023-10-29 NOTE — Therapy (Unsigned)
 OUTPATIENT PHYSICAL THERAPY TREATMENT NOTE   Patient Name: Kayla Guerra MRN: [id number] DOB:October 18, 1955, 68 y.o., female Today's Date: 10/30/2023   PCP: Yolande Toribio MATSU, MD  REFERRING PROVIDER: Lennie Jamee Norris, MD    END OF SESSION:  PT End of Session - 10/29/23 1625     Visit Number 13    Number of Visits 14    Date for PT Re-Evaluation 11/20/23    Authorization Type BCBS Medicare    PT Start Time 1515    PT Stop Time 1553    PT Time Calculation (min) 38 min    Activity Tolerance Patient tolerated treatment well    Behavior During Therapy WFL for tasks assessed/performed                   Past Medical History:  Diagnosis Date   Anxiety    BRCA gene mutation negative    Chronic constipation    GERD (gastroesophageal reflux disease)    Headache(784.0)    Hypertension    PONV (postoperative nausea and vomiting)    Wears contact lenses    Past Surgical History:  Procedure Laterality Date   ANAL FISTULECTOMY  11/2021   Dr. Zoila at [facility]   BREAST EXCISIONAL BIOPSY Right 06-26-1999    dr honore  @[email or web address]    CHOLECYSTECTOMY N/A 02/22/2018   Procedure: LAPAROSCOPIC CHOLECYSTECTOMY WITH INTRAOPERATIVE CHOLANGIOGRAM ERAS PATHWAY;  Surgeon: Gladis Cough, MD;  Location: [facility];  Service: General;  Laterality: N/A;   D & C HYSTEROSCOPY WITH RESECTION POLYPS  10-10-2009    dr rivard  @[facility]    DILATION AND CURETTAGE OF UTERUS  2006   WISDOM TOOTH EXTRACTION     Patient Active Problem List   Diagnosis Date Noted   Family history of breast cancer 06/09/2022   Normocytic normochromic anemia 06/17/2021   Thrombocytopenia (HCC) 06/17/2021   Age-related osteoporosis without current pathological fracture 05/05/2021   History of recurrent UTIs 12/29/2018   Status post laparoscopic cholecystectomy Jan 2020 02/22/2018   Menopause 07/17/2011   Essential hypertension 01/05/2011    ONSET DATE: 3 years  REFERRING DIAG: R29.818 Difficulty  Balancing  THERAPY DIAG:  Muscle weakness (generalized)  Difficulty in walking, not elsewhere classified  Rationale for Evaluation and Treatment: Rehabilitation  SUBJECTIVE:                                                                                                                                                                                             SUBJECTIVE STATEMENT: The patient has no back pain today.  Orts no significant difficulty with their recent treatment.   Pt states she  started having pain and tingling in bilat feet approx 2-2.5 years ago.  Pt had a lumbar MRI which showed pars defect and a pinched nerve.  Pt states her foot pain and tingling are better overall.  Pt reports she has been doing home exercises though not as consistent as she would like.  She has been very busy.  Pt is going to be more consistent with HEP this week.  Pt can't sit or stand for too long.  Pt states she needs more strength to take care of her son who has special needs.  Pt accompanied by: self  PERTINENT HISTORY: anxiety, GERD, HA, HTN, per pt- osteoporosis and essential tremors  PAIN:  Are you having pain? Yes: NPRS scale: 5/10 Pain location:  central LBP Pain description: ache, little tingling in L foot Aggravating factors: bending, prolonged sitting Relieving factors: sitting up straight when sitting  PRECAUTIONS: Fall  RED FLAGS: None   WEIGHT BEARING RESTRICTIONS: No  FALLS: Has patient fallen in last 6 months? No  LIVING ENVIRONMENT: Lives with: lives with their spouse and lives with their son (son requires physical assistance d/t disability) Lives in: House/apartment Stairs: 3-4 steps to enter; 1 story Has following equipment at home: None  PLOF: Independent and retired; likes walking, going to church, working in the yard   PATIENT GOALS: improve strength and balance   OBJECTIVE:    TODAY'S TREATMENT: 08/03/23 Activity Comments 09/25/23  Single leg heel raise   Able to complete 10 reps limited ROM on R; 4-5 reps imited ROM on L   ABC 85.625% 73.75%                     Note: Objective measures were completed at Evaluation unless otherwise noted.  DIAGNOSTIC FINDINGS: none recent  COGNITION: Overall cognitive status: Within functional limits for tasks assessed   SENSATION: Pt reports tingling in B feet  COORDINATION: Alternating pronation/supination: WNL Alternating toe tap: WNL Finger to nose: WFL, with tremor  POSTURE: forward head  LOWER EXTREMITY ROM:     Active  Right Eval Left Eval  Hip flexion    Hip extension    Hip abduction    Hip adduction    Hip internal rotation    Hip external rotation    Knee flexion    Knee extension    Ankle dorsiflexion 9 13  Ankle plantarflexion    Ankle inversion    Ankle eversion     (Blank rows = not tested)    GAIT: Findings: Assistive device utilized:None, Level of assistance: Complete Independence, and Comments: slight B toe out and very slight steppage gait   FUNCTIONAL TESTS:    M-CTSIB  Condition 1: Firm Surface, EO 30 Sec, Normal Sway  Condition 2: Firm Surface, EC 30 Sec, Normal Sway  Condition 3: Foam Surface, EO 30 Sec, Normal Sway  Condition 4: Foam Surface, EC 30 Sec, Mild Sway    [facility] PT Assessment - 06/23/23 0001       Standardized Balance Assessment   Standardized Balance Assessment Five Times Sit to Stand;10 meter walk test    Five times sit to stand comments  16.3 sec without UEs   slightly limited eccentric lower   10 Meter Walk 11.02 sec      Functional Gait  Assessment   Gait assessed  Yes    Gait Level Surface Walks 20 ft in less than 5.5 sec, no assistive devices, good speed, no evidence for imbalance, normal gait pattern,  deviates no more than 6 in outside of the 12 in walkway width.    Change in Gait Speed Able to smoothly change walking speed without loss of balance or gait deviation. Deviate no more than 6 in outside of the 12 in walkway  width.    Gait with Horizontal Head Turns Performs head turns smoothly with slight change in gait velocity (eg, minor disruption to smooth gait path), deviates 6-10 in outside 12 in walkway width, or uses an assistive device.    Gait with Vertical Head Turns Performs task with slight change in gait velocity (eg, minor disruption to smooth gait path), deviates 6 - 10 in outside 12 in walkway width or uses assistive device    Gait and Pivot Turn Pivot turns safely in greater than 3 sec and stops with no loss of balance, or pivot turns safely within 3 sec and stops with mild imbalance, requires small steps to catch balance.    Step Over Obstacle Is able to step over 2 stacked shoe boxes taped together (9 in total height) without changing gait speed. No evidence of imbalance.    Gait with Narrow Base of Support Is able to ambulate for 10 steps heel to toe with no staggering.    Gait with Eyes Closed Walks 20 ft, no assistive devices, good speed, no evidence of imbalance, normal gait pattern, deviates no more than 6 in outside 12 in walkway width. Ambulates 20 ft in less than 7 sec.    Ambulating Backwards Walks 20 ft, no assistive devices, good speed, no evidence for imbalance, normal gait    Steps Alternating feet, no rail.    Total Score 27             LOWER EXTREMITY MMT:    MMT (in sitting) Right Eval Left Eval Right 08/03/23 Left 08/03/23 R/L  09/25/23 Right 8/19 Left 8/19  Hip flexion 4+ 4 4+ 4 4-/4 30.4 26.3  Hip extension         Hip abduction 4+ 4+ 4+ 4+  28.0 30.4  Hip adduction 4 4 4  4+     Hip internal rotation         Hip external rotation         Knee flexion 4+ 4 4+ 4+ 4+/4+    Knee extension 4 4+ 4+ 4+ 4+/4+    Ankle dorsiflexion 4 4 4+ 4+ 4+/4+    Ankle plantarflexion 4 4 4 4  4/4    Ankle inversion         Ankle eversion         (Blank rows = not tested)                                                                                                                              TREATMENT DATE:  9/12 Seated bike seat 8  LF Chest press 10 lbs  2x12  LF leg extension 10 lbs 2x10 Foot  small Leg 9  2x12 LF row 15 lbs  highest seat 2x12  LF triceps 15 lbs highest seat 2x12  Reviewed set up of each machine and how to use RPE    9/3  Neuro-re-ed:  [employer] 10 lbs 3x12  Cable extension 10lbs 3x12 Goblet squat 5 lbs 3x12  TRX squat 3x10  Bicep curl 5 lbs 3x10 with cuing for   Reviewed RPE and how to grade exercises.       PATIENT EDUCATION: Education details: strength findings and norms, exercise form, exercise rationale Person educated: Patient Education method: Explanation, demonstration, verbal and tactile cues Education comprehension: verbalized understanding, returned demonstration, verbal and tactile cues required  HOME EXERCISE PROGRAM: HOME EXERCISE PROGRAM Access Code: [email or web address] URL: [email or web address] Date: 07/27/2023 Prepared by: [facility] - Outpatient  Rehab - [facility]  Exercises - Squat with Chair Touch  - 1 x daily - 5 x weekly - 2 sets - 10 reps - Standing Alternating Knee Flexion with Ankle Weights  - 1 x daily - 5 x weekly - 2 sets - 10 reps - Heel Raises with Counter Support  - 1 x daily - 5 x weekly - 2 sets - 10 reps - Side Stepping with Resistance at Thighs and Counter Support  - 1 x daily - 5 x weekly - 2 sets - 30 sec hold - Forward Backward Weight Shift with Counter Support  - 1 x daily - 5 x weekly - 2 sets - 10 reps - Half-Kneeling Shoulder Extension with Resistance  - 1 x daily - 5 x weekly - 2 sets - 10 reps - Half Kneel Row  - 1 x daily - 5 x weekly - 2 sets - 10 reps - Quadruped Alternating Arm Lift  - 1 x daily - 5 x weekly - 2 sets - 10 reps  GOALS: Goals reviewed with patient? Yes  SHORT TERM GOALS: Target date: 07/14/2023  Patient to be independent with initial HEP. Baseline: HEP initiated Goal status: MET    LONG TERM GOALS: Target date: 11/20/2023  Patient to be  independent with advanced HEP. Baseline: Not yet initiated  Goal status: MET 08/02/23  2. Patient to demonstrate B LE strength >/=4+/5.  Baseline: See above; improved but not quite met 08/02/23 Goal status: IN PROGRESS 08/02/23  3.  Patient to improve ABC score by at least 10 points.  Baseline: 77.8% 06/29/23; 85.625% 08/02/23 Goal status: IN PROGRESS 08/02/23  4. Patient to report plans to participate in strength training regimen several times a week to maintain PT gains.   Baseline: not started ; pt reports plans to join [employer] today 08/02/23 Goal status: MET 08/02/23 5. Pt will improve body mechanics with lifting and reaching to help take care of her son without continued pain.   Baseline:   Goal status: New   ASSESSMENT:  CLINICAL IMPRESSION: The patient tolerated treatment well. She had no significant back pain. We reviewed porper set up and breathing for core stability with each exercise. Therapy will continue t expand her program. We also reviewed the use of the right start program,  OBJECTIVE IMPAIRMENTS: Abnormal gait, decreased balance, decreased ROM, decreased strength, impaired flexibility, and postural dysfunction.   ACTIVITY LIMITATIONS: carrying, lifting, and locomotion level  PARTICIPATION LIMITATIONS: community activity and yard work  PERSONAL FACTORS: Age, Past/current experiences, and 3+ comorbidities: anxiety, GERD, HA, HTN, per pt- osteoporosis and essential tremors are also affecting patient's functional outcome.   REHAB POTENTIAL: Good  CLINICAL DECISION MAKING:  Evolving/moderate complexity  EVALUATION COMPLEXITY: Moderate  PLAN:  PT FREQUENCY: 1x/week  PT DURATION: 6 weeks  PLANNED INTERVENTIONS: 97164- PT Re-evaluation, 97750- Physical Performance Testing, 97110-Therapeutic exercises, 97530- Therapeutic activity, [id number]- Neuromuscular re-education, 97535- Self Care, 02859- Manual therapy, [phone]- Gait training, [phone]- Canalith repositioning, Patient/Family  education, Balance training, Stair training, Taping, Dry Needling, Vestibular training, Cryotherapy, and Moist heat  PLAN FOR NEXT SESSION: Continue to work on strength, balance and functional mobility to assist son with needs.   Alm Don PT DPT 10/30/23 2:53 PM

## 2023-10-30 ENCOUNTER — Encounter (HOSPITAL_BASED_OUTPATIENT_CLINIC_OR_DEPARTMENT_OTHER): Payer: Self-pay | Admitting: Physical Therapy

## 2023-11-02 ENCOUNTER — Ambulatory Visit (HOSPITAL_BASED_OUTPATIENT_CLINIC_OR_DEPARTMENT_OTHER): Admitting: Physical Therapy

## 2023-11-02 DIAGNOSIS — M6281 Muscle weakness (generalized): Secondary | ICD-10-CM

## 2023-11-02 DIAGNOSIS — R262 Difficulty in walking, not elsewhere classified: Secondary | ICD-10-CM | POA: Diagnosis not present

## 2023-11-02 NOTE — Therapy (Unsigned)
 OUTPATIENT PHYSICAL THERAPY TREATMENT NOTE   Patient Name: Kayla Guerra MRN: [id number] DOB:26-Dec-1955, 68 y.o., female Today's Date: 11/03/2023   PCP: Yolande Toribio MATSU, MD  REFERRING PROVIDER: Lennie Jamee Norris, MD    END OF SESSION:  PT End of Session - 11/02/23 1535     Visit Number 14    Number of Visits 14    Date for PT Re-Evaluation 11/20/23    Authorization Type [employer] Medicare    PT Start Time 1300    PT Stop Time 1343    PT Time Calculation (min) 43 min    Activity Tolerance Patient tolerated treatment well    Behavior During Therapy WFL for tasks assessed/performed                    Past Medical History:  Diagnosis Date   Anxiety    BRCA gene mutation negative    Chronic constipation    GERD (gastroesophageal reflux disease)    Headache(784.0)    Hypertension    PONV (postoperative nausea and vomiting)    Wears contact lenses    Past Surgical History:  Procedure Laterality Date   ANAL FISTULECTOMY  11/2021   Dr. Zoila at [facility]   BREAST EXCISIONAL BIOPSY Right 06-26-1999    dr honore  @[email or web address]    CHOLECYSTECTOMY N/A 02/22/2018   Procedure: LAPAROSCOPIC CHOLECYSTECTOMY WITH INTRAOPERATIVE CHOLANGIOGRAM ERAS PATHWAY;  Surgeon: Gladis Cough, MD;  Location: [facility];  Service: General;  Laterality: N/A;   D & C HYSTEROSCOPY WITH RESECTION POLYPS  10-10-2009    dr rivard  @[facility]    DILATION AND CURETTAGE OF UTERUS  2006   WISDOM TOOTH EXTRACTION     Patient Active Problem List   Diagnosis Date Noted   Family history of breast cancer 06/09/2022   Normocytic normochromic anemia 06/17/2021   Thrombocytopenia (HCC) 06/17/2021   Age-related osteoporosis without current pathological fracture 05/05/2021   History of recurrent UTIs 12/29/2018   Status post laparoscopic cholecystectomy Jan 2020 02/22/2018   Menopause 07/17/2011   Essential hypertension 01/05/2011    ONSET DATE: 3 years  REFERRING DIAG: R29.818 Difficulty  Balancing  THERAPY DIAG:  Muscle weakness (generalized)  Difficulty in walking, not elsewhere classified  Rationale for Evaluation and Treatment: Rehabilitation  SUBJECTIVE:                                                                                                                                                                                             SUBJECTIVE STATEMENT: The patient has no back pain today. She reports no significant difficulty with their recent treatment.   Pt states  she started having pain and tingling in bilat feet approx 2-2.5 years ago.  Pt had a lumbar MRI which showed pars defect and a pinched nerve.  Pt states her foot pain and tingling are better overall.  Pt reports she has been doing home exercises though not as consistent as she would like.  She has been very busy.  Pt is going to be more consistent with HEP this week.  Pt can't sit or stand for too long.  Pt states she needs more strength to take care of her son who has special needs.  Pt accompanied by: self  PERTINENT HISTORY: anxiety, GERD, HA, HTN, per pt- osteoporosis and essential tremors  PAIN:  Are you having pain? Yes: NPRS scale: 5/10 Pain location:  central LBP Pain description: ache, little tingling in L foot Aggravating factors: bending, prolonged sitting Relieving factors: sitting up straight when sitting  PRECAUTIONS: Fall  RED FLAGS: None   WEIGHT BEARING RESTRICTIONS: No  FALLS: Has patient fallen in last 6 months? No  LIVING ENVIRONMENT: Lives with: lives with their spouse and lives with their son (son requires physical assistance d/t disability) Lives in: House/apartment Stairs: 3-4 steps to enter; 1 story Has following equipment at home: None  PLOF: Independent and retired; likes walking, going to church, working in the yard   PATIENT GOALS: improve strength and balance   OBJECTIVE:    TODAY'S TREATMENT: 08/03/23 Activity Comments 09/25/23  Single leg heel  raise  Able to complete 10 reps limited ROM on R; 4-5 reps imited ROM on L   ABC 85.625% 73.75%                     Note: Objective measures were completed at Evaluation unless otherwise noted.  DIAGNOSTIC FINDINGS: none recent  COGNITION: Overall cognitive status: Within functional limits for tasks assessed   SENSATION: Pt reports tingling in B feet  COORDINATION: Alternating pronation/supination: WNL Alternating toe tap: WNL Finger to nose: WFL, with tremor  POSTURE: forward head  LOWER EXTREMITY ROM:     Active  Right Eval Left Eval  Hip flexion    Hip extension    Hip abduction    Hip adduction    Hip internal rotation    Hip external rotation    Knee flexion    Knee extension    Ankle dorsiflexion 9 13  Ankle plantarflexion    Ankle inversion    Ankle eversion     (Blank rows = not tested)    GAIT: Findings: Assistive device utilized:None, Level of assistance: Complete Independence, and Comments: slight B toe out and very slight steppage gait   FUNCTIONAL TESTS:    M-CTSIB  Condition 1: Firm Surface, EO 30 Sec, Normal Sway  Condition 2: Firm Surface, EC 30 Sec, Normal Sway  Condition 3: Foam Surface, EO 30 Sec, Normal Sway  Condition 4: Foam Surface, EC 30 Sec, Mild Sway    [facility] PT Assessment - 06/23/23 0001       Standardized Balance Assessment   Standardized Balance Assessment Five Times Sit to Stand;10 meter walk test    Five times sit to stand comments  16.3 sec without UEs   slightly limited eccentric lower   10 Meter Walk 11.02 sec      Functional Gait  Assessment   Gait assessed  Yes    Gait Level Surface Walks 20 ft in less than 5.5 sec, no assistive devices, good speed, no evidence for imbalance, normal gait  pattern, deviates no more than 6 in outside of the 12 in walkway width.    Change in Gait Speed Able to smoothly change walking speed without loss of balance or gait deviation. Deviate no more than 6 in outside of the 12 in  walkway width.    Gait with Horizontal Head Turns Performs head turns smoothly with slight change in gait velocity (eg, minor disruption to smooth gait path), deviates 6-10 in outside 12 in walkway width, or uses an assistive device.    Gait with Vertical Head Turns Performs task with slight change in gait velocity (eg, minor disruption to smooth gait path), deviates 6 - 10 in outside 12 in walkway width or uses assistive device    Gait and Pivot Turn Pivot turns safely in greater than 3 sec and stops with no loss of balance, or pivot turns safely within 3 sec and stops with mild imbalance, requires small steps to catch balance.    Step Over Obstacle Is able to step over 2 stacked shoe boxes taped together (9 in total height) without changing gait speed. No evidence of imbalance.    Gait with Narrow Base of Support Is able to ambulate for 10 steps heel to toe with no staggering.    Gait with Eyes Closed Walks 20 ft, no assistive devices, good speed, no evidence of imbalance, normal gait pattern, deviates no more than 6 in outside 12 in walkway width. Ambulates 20 ft in less than 7 sec.    Ambulating Backwards Walks 20 ft, no assistive devices, good speed, no evidence for imbalance, normal gait    Steps Alternating feet, no rail.    Total Score 27             LOWER EXTREMITY MMT:    MMT (in sitting) Right Eval Left Eval Right 08/03/23 Left 08/03/23 R/L  09/25/23 Right 8/19 Left 8/19  Hip flexion 4+ 4 4+ 4 4-/4 30.4 26.3  Hip extension         Hip abduction 4+ 4+ 4+ 4+  28.0 30.4  Hip adduction 4 4 4  4+     Hip internal rotation         Hip external rotation         Knee flexion 4+ 4 4+ 4+ 4+/4+    Knee extension 4 4+ 4+ 4+ 4+/4+    Ankle dorsiflexion 4 4 4+ 4+ 4+/4+    Ankle plantarflexion 4 4 4 4  4/4    Ankle inversion         Ankle eversion         (Blank rows = not tested)                                                                                                                              TREATMENT DATE:  09/16 There-ex:  Reviewed octaine fit 5 min and set up   Neurore-ed: Chop 5 lbs  Pallof press  5 lbs  Row 10lbs  Shoulders extension 10lbs  2x10 each side   Step up 6 inch  Side step 6 inch  Step down 6 inch   2x10 each   9/12 Seated bike seat 8  LF Chest press 10 lbs  2x12  LF leg extension 10 lbs 2x10 Foot  small Leg 9  2x12 LF row 15 lbs  highest seat 2x12  LF triceps 15 lbs highest seat 2x12  Reviewed set up of each machine and how to use RPE    9/3  Neuro-re-ed:  [employer] 10 lbs 3x12  Cable extension 10lbs 3x12 Goblet squat 5 lbs 3x12  TRX squat 3x10  Bicep curl 5 lbs 3x10 with cuing for   Reviewed RPE and how to grade exercises.       PATIENT EDUCATION: Education details: strength findings and norms, exercise form, exercise rationale Person educated: Patient Education method: Explanation, demonstration, verbal and tactile cues Education comprehension: verbalized understanding, returned demonstration, verbal and tactile cues required  HOME EXERCISE PROGRAM: HOME EXERCISE PROGRAM Access Code: [email or web address] URL: [email or web address] Date: 07/27/2023 Prepared by: [facility] - [facility]  Exercises - Squat with Chair Touch  - 1 x daily - 5 x weekly - 2 sets - 10 reps - Standing Alternating Knee Flexion with Ankle Weights  - 1 x daily - 5 x weekly - 2 sets - 10 reps - Heel Raises with Counter Support  - 1 x daily - 5 x weekly - 2 sets - 10 reps - Side Stepping with Resistance at Thighs and Counter Support  - 1 x daily - 5 x weekly - 2 sets - 30 sec hold - Forward Backward Weight Shift with Counter Support  - 1 x daily - 5 x weekly - 2 sets - 10 reps - Half-Kneeling Shoulder Extension with Resistance  - 1 x daily - 5 x weekly - 2 sets - 10 reps - Half Kneel Row  - 1 x daily - 5 x weekly - 2 sets - 10 reps - Quadruped Alternating Arm Lift  - 1 x daily - 5 x weekly - 2 sets - 10  reps  GOALS: Goals reviewed with patient? Yes  SHORT TERM GOALS: Target date: 07/14/2023  Patient to be independent with initial HEP. Baseline: HEP initiated Goal status: MET    LONG TERM GOALS: Target date: 11/20/2023  Patient to be independent with advanced HEP. Baseline: Not yet initiated  Goal status: MET 08/02/23  2. Patient to demonstrate B LE strength >/=4+/5.  Baseline: See above; improved but not quite met 08/02/23 Goal status: IN PROGRESS 08/02/23  3.  Patient to improve ABC score by at least 10 points.  Baseline: 77.8% 06/29/23; 85.625% 08/02/23 Goal status: IN PROGRESS 08/02/23  4. Patient to report plans to participate in strength training regimen several times a week to maintain PT gains.   Baseline: not started ; pt reports plans to join [employer] today 08/02/23 Goal status: MET 08/02/23 5. Pt will improve body mechanics with lifting and reaching to help take care of her son without continued pain.   Baseline:   Goal status: New   ASSESSMENT:  CLINICAL IMPRESSION: Therapy continues to expand the patients gym program. We worked on different types of steps today. We also worked on core stability exercises at the cables. We reviewed how to breath and proper posture for exercises. We will continue to progress as tolerated.   OBJECTIVE IMPAIRMENTS: Abnormal gait, decreased balance,  decreased ROM, decreased strength, impaired flexibility, and postural dysfunction.   ACTIVITY LIMITATIONS: carrying, lifting, and locomotion level  PARTICIPATION LIMITATIONS: community activity and yard work  PERSONAL FACTORS: Age, Past/current experiences, and 3+ comorbidities: anxiety, GERD, HA, HTN, per pt- osteoporosis and essential tremors are also affecting patient's functional outcome.   REHAB POTENTIAL: Good  CLINICAL DECISION MAKING: Evolving/moderate complexity  EVALUATION COMPLEXITY: Moderate  PLAN:  PT FREQUENCY: 1x/week  PT DURATION: 6 weeks  PLANNED INTERVENTIONS:  [id number]- PT Re-evaluation, 97750- Physical Performance Testing, 97110-Therapeutic exercises, 97530- Therapeutic activity, [id number]- Neuromuscular re-education, 97535- Self Care, [id number]- Manual therapy, [id number]- Gait training, [phone]- Canalith repositioning, Patient/Family education, Balance training, Stair training, Taping, Dry Needling, Vestibular training, Cryotherapy, and Moist heat  PLAN FOR NEXT SESSION: Continue to work on strength, balance and functional mobility to assist son with needs.   Alm Don PT DPT 11/03/23 9:19 AM

## 2023-11-03 ENCOUNTER — Encounter (HOSPITAL_BASED_OUTPATIENT_CLINIC_OR_DEPARTMENT_OTHER): Payer: Self-pay | Admitting: Physical Therapy

## 2023-11-05 ENCOUNTER — Encounter (HOSPITAL_BASED_OUTPATIENT_CLINIC_OR_DEPARTMENT_OTHER): Payer: Self-pay | Admitting: Physical Therapy

## 2023-11-05 ENCOUNTER — Ambulatory Visit (HOSPITAL_BASED_OUTPATIENT_CLINIC_OR_DEPARTMENT_OTHER): Admitting: Physical Therapy

## 2023-11-05 DIAGNOSIS — M6281 Muscle weakness (generalized): Secondary | ICD-10-CM

## 2023-11-05 DIAGNOSIS — R262 Difficulty in walking, not elsewhere classified: Secondary | ICD-10-CM

## 2023-11-05 NOTE — Therapy (Signed)
 OUTPATIENT PHYSICAL THERAPY TREATMENT NOTE   Patient Name: Kayla Guerra MRN: [id number] DOB:1955-09-27, 68 y.o., female Today's Date: 11/05/2023   PCP: Yolande Toribio MATSU, MD  REFERRING PROVIDER: Lennie Jamee Norris, MD    END OF SESSION:  PT End of Session - 11/05/23 1438     Visit Number 15    Number of Visits 27    Date for Recertification  12/31/23    Authorization Type [employer] Medicare    PT Start Time 1432    PT Stop Time 1512    PT Time Calculation (min) 40 min    Activity Tolerance Patient tolerated treatment well    Behavior During Therapy WFL for tasks assessed/performed                    Past Medical History:  Diagnosis Date   Anxiety    BRCA gene mutation negative    Chronic constipation    GERD (gastroesophageal reflux disease)    Headache(784.0)    Hypertension    PONV (postoperative nausea and vomiting)    Wears contact lenses    Past Surgical History:  Procedure Laterality Date   ANAL FISTULECTOMY  11/2021   Dr[facility] Zoila at [facility]   BREAST EXCISIONAL BIOPSY Right 06-26-1999    dr honore  @[facility]    CHOLECYSTECTOMY N/A 02/22/2018   Procedure: LAPAROSCOPIC CHOLECYSTECTOMY WITH INTRAOPERATIVE CHOLANGIOGRAM ERAS PATHWAY;  Surgeon: Gladis Cough, MD;  Location: [facility];  Service: General;  Laterality: N/A;   D & C HYSTEROSCOPY WITH RESECTION POLYPS  10-10-2009    dr rivard  @[facility]    DILATION AND CURETTAGE OF UTERUS  2006   WISDOM TOOTH EXTRACTION     Patient Active Problem List   Diagnosis Date Noted   Family history of breast cancer 06/09/2022   Normocytic normochromic anemia 06/17/2021   Thrombocytopenia (HCC) 06/17/2021   Age-related osteoporosis without current pathological fracture 05/05/2021   History of recurrent UTIs 12/29/2018   Status post laparoscopic cholecystectomy Jan 2020 02/22/2018   Menopause 07/17/2011   Essential hypertension 01/05/2011  Progress Note Reporting Period 09/25/2023 to 11/05/2023  See note below  for Objective Data and Assessment of Progress/Goals.      ONSET DATE: 3 years  REFERRING DIAG: R29.818 Difficulty Balancing  THERAPY DIAG:  Muscle weakness (generalized)  Difficulty in walking, not elsewhere classified  Rationale for Evaluation and Treatment: Rehabilitation  SUBJECTIVE:                                                                                                                                                                                             SUBJECTIVE  STATEMENT: The patient has progressed well.  She has been able to work through a gym program without any significant flares and low back pain.  We available to progress her weights.  She is feeling more comfortable with an independent gym program.  She feels like functionally she is getting stronger with her daily activities and taking care of her son.   Pt states she started having pain and tingling in bilat feet approx 2-2.5 years ago.  Pt had a lumbar MRI which showed pars defect and a pinched nerve.  Pt states her foot pain and tingling are better overall.  Pt reports she has been doing home exercises though not as consistent as she would like.  She has been very busy.  Pt is going to be more consistent with HEP this week.  Pt can't sit or stand for too long.  Pt states she needs more strength to take care of her son who has special needs.  Pt accompanied by: self  PERTINENT HISTORY: anxiety, GERD, HA, HTN, per pt- osteoporosis and essential tremors  PAIN:  Are you having pain? Yes: NPRS scale: 5/10 Pain location:  central LBP Pain description: ache, little tingling in L foot Aggravating factors: bending, prolonged sitting Relieving factors: sitting up straight when sitting  PRECAUTIONS: Fall  RED FLAGS: None   WEIGHT BEARING RESTRICTIONS: No  FALLS: Has patient fallen in last 6 months? No  LIVING ENVIRONMENT: Lives with: lives with their spouse and lives with their son (son requires  physical assistance d/t disability) Lives in: House/apartment Stairs: 3-4 steps to enter; 1 story Has following equipment at home: None  PLOF: Independent and retired; likes walking, going to church, working in the yard   PATIENT GOALS: improve strength and balance   OBJECTIVE:    TODAY'S TREATMENT: 08/03/23 Activity Comments 09/25/23  Single leg heel raise  Able to complete 10 reps limited ROM on R; 4-5 reps imited ROM on L   ABC 85.625% 73.75%                     Note: Objective measures were completed at Evaluation unless otherwise noted.  DIAGNOSTIC FINDINGS: none recent  COGNITION: Overall cognitive status: Within functional limits for tasks assessed   SENSATION: Pt reports tingling in B feet  COORDINATION: Alternating pronation/supination: WNL Alternating toe tap: WNL Finger to nose: WFL, with tremor  POSTURE: forward head  LOWER EXTREMITY ROM:     Active  Right Eval Left Eval  Hip flexion    Hip extension    Hip abduction    Hip adduction    Hip internal rotation    Hip external rotation    Knee flexion    Knee extension    Ankle dorsiflexion 9 13  Ankle plantarflexion    Ankle inversion    Ankle eversion     (Blank rows = not tested)    GAIT: Findings: Assistive device utilized:None, Level of assistance: Complete Independence, and Comments: slight B toe out and very slight steppage gait   FUNCTIONAL TESTS:    M-CTSIB  Condition 1: Firm Surface, EO 30 Sec, Normal Sway  Condition 2: Firm Surface, EC 30 Sec, Normal Sway  Condition 3: Foam Surface, EO 30 Sec, Normal Sway  Condition 4: Foam Surface, EC 30 Sec, Mild Sway    [facility] PT Assessment - 06/23/23 0001       Standardized Balance Assessment   Standardized Balance Assessment Five Times Sit to Stand;10 meter walk test  Five times sit to stand comments  16.3 sec without UEs   slightly limited eccentric lower   10 Meter Walk 11.02 sec      Functional Gait  Assessment   Gait  assessed  Yes    Gait Level Surface Walks 20 ft in less than 5.5 sec, no assistive devices, good speed, no evidence for imbalance, normal gait pattern, deviates no more than 6 in outside of the 12 in walkway width.    Change in Gait Speed Able to smoothly change walking speed without loss of balance or gait deviation. Deviate no more than 6 in outside of the 12 in walkway width.    Gait with Horizontal Head Turns Performs head turns smoothly with slight change in gait velocity (eg, minor disruption to smooth gait path), deviates 6-10 in outside 12 in walkway width, or uses an assistive device.    Gait with Vertical Head Turns Performs task with slight change in gait velocity (eg, minor disruption to smooth gait path), deviates 6 - 10 in outside 12 in walkway width or uses assistive device    Gait and Pivot Turn Pivot turns safely in greater than 3 sec and stops with no loss of balance, or pivot turns safely within 3 sec and stops with mild imbalance, requires small steps to catch balance.    Step Over Obstacle Is able to step over 2 stacked shoe boxes taped together (9 in total height) without changing gait speed. No evidence of imbalance.    Gait with Narrow Base of Support Is able to ambulate for 10 steps heel to toe with no staggering.    Gait with Eyes Closed Walks 20 ft, no assistive devices, good speed, no evidence of imbalance, normal gait pattern, deviates no more than 6 in outside 12 in walkway width. Ambulates 20 ft in less than 7 sec.    Ambulating Backwards Walks 20 ft, no assistive devices, good speed, no evidence for imbalance, normal gait    Steps Alternating feet, no rail.    Total Score 27             LOWER EXTREMITY MMT:    MMT (in sitting) Right Eval Left Eval Right 08/03/23 Left 08/03/23 R/L  09/25/23 Right 8/19 Left 8/19 Right  9/19 Left 9/19  Hip flexion 4+ 4 4+ 4 4-/4 30.4 26.3 28.9 25.2  Hip extension           Hip abduction 4+ 4+ 4+ 4+  28.0 30.4 39.6 40.8   Hip adduction 4 4 4  4+       Hip internal rotation           Hip external rotation           Knee flexion 4+ 4 4+ 4+ 4+/4+      Knee extension 4 4+ 4+ 4+ 4+/4+   34.2 33.2  Ankle dorsiflexion 4 4 4+ 4+ 4+/4+      Ankle plantarflexion 4 4 4 4  4/4      Ankle inversion           Ankle eversion           (Blank rows = not tested)  TREATMENT DATE:  09/19 There-ex:  Reviewed set up of upright bike and progression of resistance  LF hip abduction 40 lbs 3x12  LF knee extension 3x12 10 lbs  Cybex leg press 50 lbs 3x12   Neuro-re-ed:  Air-ex slow march 3x10  Air-ex squat 2x10   Reviewed strength measurements and goals  09/16 There-ex:  Reviewed octaine fit 5 min and set up   Neurore-ed: Chop 5 lbs  Pallof press 5 lbs  Row 10lbs  Shoulders extension 10lbs  2x10 each side   Step up 6 inch  Side step 6 inch  Step down 6 inch   2x10 each   9/12 Seated bike seat 8  LF Chest press 10 lbs  2x12  LF leg extension 10 lbs 2x10 Foot  small Leg 9  2x12 LF row 15 lbs  highest seat 2x12  LF triceps 15 lbs highest seat 2x12  Reviewed set up of each machine and how to use RPE    9/3  Neuro-re-ed:  Cable Row 10 lbs 3x12  Cable extension 10lbs 3x12 Goblet squat 5 lbs 3x12  TRX squat 3x10  Bicep curl 5 lbs 3x10 with cuing for   Reviewed RPE and how to grade exercises.    Assessment: The patient has progressed well.  She is overall improved strength per [employer] testing.  She feels like functionally she is has more strength.  She has not been having as much back pain.  She has developed a gym program without any significant increase in low back pain.  We reviewed gym exercises today.  We also reviewed balance exercises.  She would benefit from further skilled therapy to finalize gym HEP.  She will likely at that time transition to a right start  program.   PATIENT EDUCATION: Education details: strength findings and norms, exercise form, exercise rationale Person educated: Patient Education method: Explanation, demonstration, verbal and tactile cues Education comprehension: verbalized understanding, returned demonstration, verbal and tactile cues required  HOME EXERCISE PROGRAM: HOME EXERCISE PROGRAM Access Code: [email or web address] URL: [email or web address] Date: 07/27/2023 Prepared by: [facility] - [facility]  Exercises - Squat with Chair Touch  - 1 x daily - 5 x weekly - 2 sets - 10 reps - Standing Alternating Knee Flexion with Ankle Weights  - 1 x daily - 5 x weekly - 2 sets - 10 reps - Heel Raises with Counter Support  - 1 x daily - 5 x weekly - 2 sets - 10 reps - Side Stepping with Resistance at Thighs and Counter Support  - 1 x daily - 5 x weekly - 2 sets - 30 sec hold - Forward Backward Weight Shift with Counter Support  - 1 x daily - 5 x weekly - 2 sets - 10 reps - Half-Kneeling Shoulder Extension with Resistance  - 1 x daily - 5 x weekly - 2 sets - 10 reps - Half Kneel Row  - 1 x daily - 5 x weekly - 2 sets - 10 reps - Quadruped Alternating Arm Lift  - 1 x daily - 5 x weekly - 2 sets - 10 reps  GOALS: Goals reviewed with patient? Yes  SHORT TERM GOALS: Target date: 07/14/2023  Patient to be independent with initial HEP. Baseline: HEP initiated Goal status: MET    LONG TERM GOALS: Target date: 11/20/2023  Patient to be independent with advanced HEP. Baseline: Not yet initiated  Goal status: MET 08/02/23  2. Patient to demonstrate a 5 pound  increase in all motions tested in order to demonstrate improved ability to perform daily tasks Baseline: See above; improved but not quite met 08/02/23 Goal status: Using hand dino goal modified  3.  Patient to improve ABC score by at least 10 points.  Baseline: 77.8% 06/29/23; 85.625% 08/02/23 Goal status: IN PROGRESS 9/19  4. Patient to  report plans to participate in strength training regimen several times a week to maintain PT gains.   Baseline: not started ; pt reports plans to join [employer] today 08/02/23 Goal status: MET 08/02/23 5. Pt will improve body mechanics with lifting and reaching to help take care of her son without continued pain.   Baseline:   Goal status: Progressing 9/19   ASSESSMENT:  CLINICAL IMPRESSION: Therapy continues to expand the patients gym program. We worked on different types of steps today. We also worked on core stability exercises at the cables. We reviewed how to breath and proper posture for exercises. We will continue to progress as tolerated.   OBJECTIVE IMPAIRMENTS: Abnormal gait, decreased balance, decreased ROM, decreased strength, impaired flexibility, and postural dysfunction.   ACTIVITY LIMITATIONS: carrying, lifting, and locomotion level  PARTICIPATION LIMITATIONS: community activity and yard work  PERSONAL FACTORS: Age, Past/current experiences, and 3+ comorbidities: anxiety, GERD, HA, HTN, per pt- osteoporosis and essential tremors are also affecting patient's functional outcome.   REHAB POTENTIAL: Good  CLINICAL DECISION MAKING: Evolving/moderate complexity  EVALUATION COMPLEXITY: Moderate  PLAN:  PT FREQUENCY: 1x/week  PT DURATION: 6 weeks  PLANNED INTERVENTIONS: [id number]- PT Re-evaluation, 97750- Physical Performance Testing, 97110-Therapeutic exercises, 97530- Therapeutic activity, [id number]- Neuromuscular re-education, 97535- Self Care, [id number]- Manual therapy, [id number]- Gait training, [phone]- Canalith repositioning, Patient/Family education, Balance training, Stair training, Taping, Dry Needling, Vestibular training, Cryotherapy, and Moist heat  PLAN FOR NEXT SESSION: Continue to work on strength, balance and functional mobility to assist son with needs.   Alm Don PT DPT 11/05/23 2:46 PM

## 2023-11-09 ENCOUNTER — Encounter (HOSPITAL_BASED_OUTPATIENT_CLINIC_OR_DEPARTMENT_OTHER): Payer: Self-pay | Admitting: Physical Therapy

## 2023-11-09 ENCOUNTER — Ambulatory Visit (HOSPITAL_BASED_OUTPATIENT_CLINIC_OR_DEPARTMENT_OTHER): Admitting: Physical Therapy

## 2023-11-09 DIAGNOSIS — M6281 Muscle weakness (generalized): Secondary | ICD-10-CM | POA: Diagnosis not present

## 2023-11-09 DIAGNOSIS — R262 Difficulty in walking, not elsewhere classified: Secondary | ICD-10-CM

## 2023-11-09 NOTE — Therapy (Addendum)
 OUTPATIENT PHYSICAL THERAPY TREATMENT NOTE   Patient Name: RASHANDA MAGLOIRE MRN: [id number] DOB:1955/09/04, 68 y.o., female Today's Date: 11/09/2023   PCP: Yolande Toribio MATSU, MD  REFERRING PROVIDER: Lennie Jamee Norris, MD    END OF SESSION:  PT End of Session - 11/09/23 1150     Visit Number 16    Number of Visits 27    Date for Recertification  12/31/23    Authorization Type BC[employer] Medicare    PT Start Time 1150    PT Stop Time 1230    PT Time Calculation (min) 40 min    Activity Tolerance Patient tolerated treatment well    Behavior During Therapy WFL for tasks assessed/performed           Past Medical History:  Diagnosis Date   Anxiety    BRCA gene mutation negative    Chronic constipation    GERD (gastroesophageal reflux disease)    Headache(784.0)    Hypertension    PONV (postoperative nausea and vomiting)    Wears contact lenses    Past Surgical History:  Procedure Laterality Date   ANAL FISTULECTOMY  11/2021   Dr. Zoila at [facility]   BREAST EXCISIONAL BIOPSY Right 06-26-1999    dr honore  @[facility]    CHOLECYSTECTOMY N/A 02/22/2018   Procedure: LAPAROSCOPIC CHOLECYSTECTOMY WITH INTRAOPERATIVE CHOLANGIOGRAM ERAS PATHWAY;  Surgeon: Gladis Cough, MD;  Location: [facility];  Service: General;  Laterality: N/A;   D & C HYSTEROSCOPY WITH RESECTION POLYPS  10-10-2009    dr rivard  @[facility]    DILATION AND CURETTAGE OF UTERUS  2006   WISDOM TOOTH EXTRACTION     Patient Active Problem List   Diagnosis Date Noted   Family history of breast cancer 06/09/2022   Normocytic normochromic anemia 06/17/2021   Thrombocytopenia 06/17/2021   Age-related osteoporosis without current pathological fracture 05/05/2021   History of recurrent UTIs 12/29/2018   Status post laparoscopic cholecystectomy Jan 2020 02/22/2018   Menopause 07/17/2011   Essential hypertension 01/05/2011  Progress Note Reporting Period 09/25/2023 to 11/05/2023  See note below for Objective Data and  Assessment of Progress/Goals.      ONSET DATE: 3 years  REFERRING DIAG: R29.818 Difficulty Balancing  THERAPY DIAG:  Muscle weakness (generalized)  Difficulty in walking, not elsewhere classified  Rationale for Evaluation and Treatment: Rehabilitation  SUBJECTIVE:                                                                                                                                                                                             SUBJECTIVE STATEMENT: Reports that she's doing okay but she is a  little sore from twisting in the car. States it was not something she did previously in therapy. Pt states she started having pain and tingling in bilat feet approx 2-2.5 years ago.  Pt had a lumbar MRI which showed pars defect and a pinched nerve.  Pt states her foot pain and tingling are better overall.  Pt reports she has been doing home exercises though not as consistent as she would like.  She has been very busy.  Pt is going to be more consistent with HEP this week.  Pt can't sit or stand for too long.  Pt states she needs more strength to take care of her son who has special needs. Pt accompanied by: self  PERTINENT HISTORY: anxiety, GERD, HA, HTN, per pt- osteoporosis and essential tremors  PAIN:  Are you having pain? Yes: NPRS scale: 5/10 Pain location:  central LBP Pain description: ache, little tingling in L foot Aggravating factors: bending, prolonged sitting Relieving factors: sitting up straight when sitting  PRECAUTIONS: Fall  RED FLAGS: None   WEIGHT BEARING RESTRICTIONS: No  FALLS: Has patient fallen in last 6 months? No  LIVING ENVIRONMENT: Lives with: lives with their spouse and lives with their son (son requires physical assistance d/t disability) Lives in: House/apartment Stairs: 3-4 steps to enter; 1 story Has following equipment at home: None  PLOF: Independent and retired; likes walking, going to church, working in the yard   PATIENT  GOALS: improve strength and balance   OBJECTIVE:    TODAY'S TREATMENT: 08/03/23 Activity Comments 09/25/23  Single leg heel raise  Able to complete 10 reps limited ROM on R; 4-5 reps imited ROM on L   ABC 85.625% 73.75%                     Note: Objective measures were completed at Evaluation unless otherwise noted.  DIAGNOSTIC FINDINGS: none recent  COGNITION: Overall cognitive status: Within functional limits for tasks assessed   SENSATION: Pt reports tingling in B feet  COORDINATION: Alternating pronation/supination: WNL Alternating toe tap: WNL Finger to nose: WFL, with tremor  POSTURE: forward head  LOWER EXTREMITY ROM:     Active  Right Eval Left Eval  Hip flexion    Hip extension    Hip abduction    Hip adduction    Hip internal rotation    Hip external rotation    Knee flexion    Knee extension    Ankle dorsiflexion 9 13  Ankle plantarflexion    Ankle inversion    Ankle eversion     (Blank rows = not tested)    GAIT: Findings: Assistive device utilized:None, Level of assistance: Complete Independence, and Comments: slight B toe out and very slight steppage gait   FUNCTIONAL TESTS:    M-CTSIB  Condition 1: Firm Surface, EO 30 Sec, Normal Sway  Condition 2: Firm Surface, EC 30 Sec, Normal Sway  Condition 3: Foam Surface, EO 30 Sec, Normal Sway  Condition 4: Foam Surface, EC 30 Sec, Mild Sway    [facility] PT Assessment - 06/23/23 0001       Standardized Balance Assessment   Standardized Balance Assessment Five Times Sit to Stand;10 meter walk test    Five times sit to stand comments  16.3 sec without UEs   slightly limited eccentric lower   10 Meter Walk 11.02 sec      Functional Gait  Assessment   Gait assessed  Yes    Gait Level Surface Walks  20 ft in less than 5.5 sec, no assistive devices, good speed, no evidence for imbalance, normal gait pattern, deviates no more than 6 in outside of the 12 in walkway width.    Change in Gait Speed  Able to smoothly change walking speed without loss of balance or gait deviation. Deviate no more than 6 in outside of the 12 in walkway width.    Gait with Horizontal Head Turns Performs head turns smoothly with slight change in gait velocity (eg, minor disruption to smooth gait path), deviates 6-10 in outside 12 in walkway width, or uses an assistive device.    Gait with Vertical Head Turns Performs task with slight change in gait velocity (eg, minor disruption to smooth gait path), deviates 6 - 10 in outside 12 in walkway width or uses assistive device    Gait and Pivot Turn Pivot turns safely in greater than 3 sec and stops with no loss of balance, or pivot turns safely within 3 sec and stops with mild imbalance, requires small steps to catch balance.    Step Over Obstacle Is able to step over 2 stacked shoe boxes taped together (9 in total height) without changing gait speed. No evidence of imbalance.    Gait with Narrow Base of Support Is able to ambulate for 10 steps heel to toe with no staggering.    Gait with Eyes Closed Walks 20 ft, no assistive devices, good speed, no evidence of imbalance, normal gait pattern, deviates no more than 6 in outside 12 in walkway width. Ambulates 20 ft in less than 7 sec.    Ambulating Backwards Walks 20 ft, no assistive devices, good speed, no evidence for imbalance, normal gait    Steps Alternating feet, no rail.    Total Score 27             LOWER EXTREMITY MMT:    MMT (in sitting) Right Eval Left Eval Right 08/03/23 Left 08/03/23 R/L  09/25/23 Right 8/19 Left 8/19 Right  9/19 Left 9/19  Hip flexion 4+ 4 4+ 4 4-/4 30.4 26.3 28.9 25.2  Hip extension           Hip abduction 4+ 4+ 4+ 4+  28.0 30.4 39.6 40.8  Hip adduction 4 4 4  4+       Hip internal rotation           Hip external rotation           Knee flexion 4+ 4 4+ 4+ 4+/4+      Knee extension 4 4+ 4+ 4+ 4+/4+   34.2 33.2  Ankle dorsiflexion 4 4 4+ 4+ 4+/4+      Ankle plantarflexion 4 4  4 4  4/4      Ankle inversion           Ankle eversion           (Blank rows = not tested)  TREATMENT DATE:  9/23 SciFit x5 min lvl 4 Seated ball rollouts 2x10  Hip abduction machine 3x12  Cybex leg press 3x10 Straight arm lat pull downs x10 at 10# (RPE 8), decreased to 2x8 Cable row 2x10 at 10#  Attempted LF leg press but patient had pain.   09/19 There-ex:  Reviewed set up of upright bike and progression of resistance  LF hip abduction 40 lbs 3x12  LF knee extension 3x12 10 lbs  Cybex leg press 50 lbs 3x12   Neuro-re-ed:  Air-ex slow march 3x10  Air-ex squat 2x10   Reviewed strength measurements and goals  09/16 There-ex:  Reviewed octaine fit 5 min and set up   Neurore-ed: Chop 5 lbs  Pallof press 5 lbs  Row 10lbs  Shoulders extension 10lbs  2x10 each side   Step up 6 inch  Side step 6 inch  Step down 6 inch   2x10 each   9/12 Seated bike seat 8  LF Chest press 10 lbs  2x12  LF leg extension 10 lbs 2x10 Foot  small Leg 9  2x12 LF row 15 lbs  highest seat 2x12  LF triceps 15 lbs highest seat 2x12  Reviewed set up of each machine and how to use RPE    9/3  Neuro-re-ed:  Cable Row 10 lbs 3x12  Cable extension 10lbs 3x12 Goblet squat 5 lbs 3x12  TRX squat 3x10  Bicep curl 5 lbs 3x10 with cuing for   Reviewed RPE and how to grade exercises.     PATIENT EDUCATION: Education details: strength findings and norms, exercise form, exercise rationale Person educated: Patient Education method: Explanation, demonstration, verbal and tactile cues Education comprehension: verbalized understanding, returned demonstration, verbal and tactile cues required  HOME EXERCISE PROGRAM: HOME EXERCISE PROGRAM Access Code: [email or web address] URL: [email or web address] Date: 07/27/2023 Prepared by: [facility] - [facility]  Exercises - Squat with Chair Touch  - 1 x daily - 5 x weekly - 2 sets - 10 reps - Standing Alternating Knee Flexion with Ankle Weights  - 1 x daily - 5 x weekly - 2 sets - 10 reps - Heel Raises with Counter Support  - 1 x daily - 5 x weekly - 2 sets - 10 reps - Side Stepping with Resistance at Thighs and Counter Support  - 1 x daily - 5 x weekly - 2 sets - 30 sec hold - Forward Backward Weight Shift with Counter Support  - 1 x daily - 5 x weekly - 2 sets - 10 reps - Half-Kneeling Shoulder Extension with Resistance  - 1 x daily - 5 x weekly - 2 sets - 10 reps - Half Kneel Row  - 1 x daily - 5 x weekly - 2 sets - 10 reps - Quadruped Alternating Arm Lift  - 1 x daily - 5 x weekly - 2 sets - 10 reps  GOALS: Goals reviewed with patient? Yes  SHORT TERM GOALS: Target date: 07/14/2023  Patient to be independent with initial HEP. Baseline: HEP initiated Goal status: MET  LONG TERM GOALS: Target date: 11/20/2023  Patient to be independent with advanced HEP. Baseline: Not yet initiated  Goal status: MET 08/02/23  2. Patient to demonstrate a 5 pound increase in all motions tested in order to demonstrate improved ability to perform daily tasks Baseline: See above; improved but not quite met 08/02/23 Goal status: Using hand dino goal modified  3.  Patient to improve ABC score by at  least 10 points.  Baseline: 77.8% 06/29/23; 85.625% 08/02/23 Goal status: IN PROGRESS 9/19  4. Patient to report plans to participate in strength training regimen several times a week to maintain PT gains.   Baseline: not started ; pt reports plans to join [employer] today 08/02/23 Goal status: MET 08/02/23 5. Pt will improve body mechanics with lifting and reaching to help take care of her son without continued pain.   Baseline:   Goal status: Progressing 9/19   ASSESSMENT:  CLINICAL IMPRESSION: Patient is continuing to progress with exercise tolerance. Tolerated therapy well with a decrease in pain  throughout session. Patient feels confident enough to add machine exercises to gym routine. Educated on setting up machines for exercises. Advised to add lumbar stretch to home routine to aid in pain management. Will continue to benefit from therapy to address remaining strength impairments and pain management.   OBJECTIVE IMPAIRMENTS: Abnormal gait, decreased balance, decreased ROM, decreased strength, impaired flexibility, and postural dysfunction.   ACTIVITY LIMITATIONS: carrying, lifting, and locomotion level  PARTICIPATION LIMITATIONS: community activity and yard work  PERSONAL FACTORS: Age, Past/current experiences, and 3+ comorbidities: anxiety, GERD, HA, HTN, per pt- osteoporosis and essential tremors are also affecting patient's functional outcome.   REHAB POTENTIAL: Good  CLINICAL DECISION MAKING: Evolving/moderate complexity  EVALUATION COMPLEXITY: Moderate  PLAN:  PT FREQUENCY: 1x/week  PT DURATION: 6 weeks  PLANNED INTERVENTIONS: 97164- PT Re-evaluation, 97750- Physical Performance Testing, 97110-Therapeutic exercises, 97530- Therapeutic activity, [id number]- Neuromuscular re-education, 97535- Self Care, [id number]- Manual therapy, [id number]- Gait training, [phone]- Canalith repositioning, Patient/Family education, Balance training, Stair training, Taping, Dry Needling, Vestibular training, Cryotherapy, and Moist heat  PLAN FOR NEXT SESSION: Continue to work on strength, balance and functional mobility to assist son with needs.   Lili Finder, Student-PT 11/09/2023, 12:02 PM

## 2023-11-10 DIAGNOSIS — H18413 Arcus senilis, bilateral: Secondary | ICD-10-CM | POA: Diagnosis not present

## 2023-11-10 DIAGNOSIS — H2513 Age-related nuclear cataract, bilateral: Secondary | ICD-10-CM | POA: Diagnosis not present

## 2023-11-10 DIAGNOSIS — H04123 Dry eye syndrome of bilateral lacrimal glands: Secondary | ICD-10-CM | POA: Diagnosis not present

## 2023-11-12 ENCOUNTER — Encounter (HOSPITAL_BASED_OUTPATIENT_CLINIC_OR_DEPARTMENT_OTHER): Admitting: Physical Therapy

## 2023-11-15 ENCOUNTER — Encounter (HOSPITAL_BASED_OUTPATIENT_CLINIC_OR_DEPARTMENT_OTHER): Payer: Self-pay

## 2023-11-15 ENCOUNTER — Telehealth (HOSPITAL_BASED_OUTPATIENT_CLINIC_OR_DEPARTMENT_OTHER): Payer: Self-pay

## 2023-11-15 ENCOUNTER — Ambulatory Visit (HOSPITAL_BASED_OUTPATIENT_CLINIC_OR_DEPARTMENT_OTHER)

## 2023-11-15 ENCOUNTER — Other Ambulatory Visit (HOSPITAL_BASED_OUTPATIENT_CLINIC_OR_DEPARTMENT_OTHER): Payer: Self-pay | Admitting: Certified Nurse Midwife

## 2023-11-15 VITALS — BP 139/80 | HR 66

## 2023-11-15 DIAGNOSIS — R399 Unspecified symptoms and signs involving the genitourinary system: Secondary | ICD-10-CM | POA: Diagnosis not present

## 2023-11-15 LAB — POCT URINALYSIS DIP (CLINITEK)
Bilirubin, UA: NEGATIVE
Blood, UA: NEGATIVE
Glucose, UA: NEGATIVE mg/dL
Ketones, POC UA: NEGATIVE mg/dL
Leukocytes, UA: NEGATIVE
Nitrite, UA: NEGATIVE
POC PROTEIN,UA: NEGATIVE
Spec Grav, UA: <=1.005 — AB (ref 1.010–1.025)
Urobilinogen, UA: 0.2 U/dL
pH, UA: 5.5 (ref 5.0–8.0)

## 2023-11-15 NOTE — Telephone Encounter (Signed)
 Spoke with patient. Patient is calling regarding urinalysis results seen in mychart from her visit today. Advised specific gravity was low but this could indicate that she is very well hydrated. Advised we are awaiting urine culture results and we will contact her with her results as soon as possible. Patient verbalizes understanding.

## 2023-11-15 NOTE — Telephone Encounter (Signed)
 Patient would like for you to give her a call concerning some results she just received in Mychart.

## 2023-11-15 NOTE — Progress Notes (Signed)
 NURSE VISIT- UTI SYMPTOMS   SUBJECTIVE:  Kayla Guerra is a 68 y.o. [id number] female here for UTI symptoms. She is a GYN patient. She reports headache, pelvic pressure and urinary frequency.  Pt reports that she just recently had UTI at the beginning of September. This has been a recurrent issue that she sees a urologist for. She reports that medications such as Bactrim  do not help and the only medication that seems to help treat her UTI is Cipro.  OBJECTIVE:  LMP 07/24/2010   Appears well, in no apparent distress  No results found for this or any previous visit (from the past 24 hours).  ASSESSMENT: GYN patient with UTI symptoms and negative nitrites  PLAN: Visit routed to or discussed with:  Rx sent today: No Urine culture sent Call or return to clinic prn if these symptoms worsen or fail to improve as anticipated. Follow-up: as needed  Morna LOISE Quale, RN

## 2023-11-16 ENCOUNTER — Encounter (HOSPITAL_BASED_OUTPATIENT_CLINIC_OR_DEPARTMENT_OTHER): Admitting: Physical Therapy

## 2023-11-17 ENCOUNTER — Ambulatory Visit (HOSPITAL_BASED_OUTPATIENT_CLINIC_OR_DEPARTMENT_OTHER): Payer: Self-pay | Admitting: Certified Nurse Midwife

## 2023-11-17 LAB — URINE CULTURE

## 2023-11-18 ENCOUNTER — Ambulatory Visit (HOSPITAL_BASED_OUTPATIENT_CLINIC_OR_DEPARTMENT_OTHER): Admitting: Physical Therapy

## 2023-11-18 DIAGNOSIS — Z23 Encounter for immunization: Secondary | ICD-10-CM | POA: Diagnosis not present

## 2023-11-23 ENCOUNTER — Encounter (HOSPITAL_BASED_OUTPATIENT_CLINIC_OR_DEPARTMENT_OTHER): Admitting: Physical Therapy

## 2023-11-25 ENCOUNTER — Encounter (HOSPITAL_BASED_OUTPATIENT_CLINIC_OR_DEPARTMENT_OTHER): Admitting: Physical Therapy

## 2023-11-30 ENCOUNTER — Encounter (HOSPITAL_BASED_OUTPATIENT_CLINIC_OR_DEPARTMENT_OTHER): Admitting: Physical Therapy

## 2023-11-30 DIAGNOSIS — F419 Anxiety disorder, unspecified: Secondary | ICD-10-CM | POA: Diagnosis not present

## 2023-11-30 DIAGNOSIS — H2512 Age-related nuclear cataract, left eye: Secondary | ICD-10-CM | POA: Diagnosis not present

## 2023-11-30 DIAGNOSIS — H268 Other specified cataract: Secondary | ICD-10-CM | POA: Diagnosis not present

## 2023-11-30 DIAGNOSIS — H2511 Age-related nuclear cataract, right eye: Secondary | ICD-10-CM | POA: Diagnosis not present

## 2023-11-30 DIAGNOSIS — H25042 Posterior subcapsular polar age-related cataract, left eye: Secondary | ICD-10-CM | POA: Diagnosis not present

## 2023-11-30 DIAGNOSIS — H25012 Cortical age-related cataract, left eye: Secondary | ICD-10-CM | POA: Diagnosis not present

## 2023-12-02 ENCOUNTER — Encounter (HOSPITAL_BASED_OUTPATIENT_CLINIC_OR_DEPARTMENT_OTHER): Admitting: Physical Therapy

## 2023-12-06 ENCOUNTER — Encounter (HOSPITAL_BASED_OUTPATIENT_CLINIC_OR_DEPARTMENT_OTHER): Admitting: Physical Therapy

## 2023-12-07 ENCOUNTER — Encounter (HOSPITAL_BASED_OUTPATIENT_CLINIC_OR_DEPARTMENT_OTHER): Admitting: Physical Therapy

## 2023-12-07 DIAGNOSIS — H2512 Age-related nuclear cataract, left eye: Secondary | ICD-10-CM | POA: Diagnosis not present

## 2023-12-07 DIAGNOSIS — F419 Anxiety disorder, unspecified: Secondary | ICD-10-CM | POA: Diagnosis not present

## 2023-12-07 DIAGNOSIS — H268 Other specified cataract: Secondary | ICD-10-CM | POA: Diagnosis not present

## 2023-12-09 ENCOUNTER — Ambulatory Visit (HOSPITAL_BASED_OUTPATIENT_CLINIC_OR_DEPARTMENT_OTHER): Admitting: Physical Therapy

## 2023-12-13 ENCOUNTER — Encounter (HOSPITAL_BASED_OUTPATIENT_CLINIC_OR_DEPARTMENT_OTHER): Admitting: Physical Therapy

## 2023-12-14 ENCOUNTER — Encounter (HOSPITAL_BASED_OUTPATIENT_CLINIC_OR_DEPARTMENT_OTHER): Admitting: Physical Therapy

## 2023-12-16 ENCOUNTER — Encounter (HOSPITAL_BASED_OUTPATIENT_CLINIC_OR_DEPARTMENT_OTHER): Admitting: Physical Therapy

## 2024-01-28 DIAGNOSIS — Z87440 Personal history of urinary (tract) infections: Secondary | ICD-10-CM | POA: Diagnosis not present
